# Patient Record
Sex: Female | Born: 1970 | Race: White | Hispanic: No | Marital: Single | Smoking: Never smoker
Health system: Southern US, Community
[De-identification: ages and names within clinical notes are randomized; demographics above are authoritative.]

## PROBLEM LIST (undated history)

## (undated) DIAGNOSIS — F329 Major depressive disorder, single episode, unspecified: Secondary | ICD-10-CM

## (undated) DIAGNOSIS — Z9889 Other specified postprocedural states: Secondary | ICD-10-CM

## (undated) DIAGNOSIS — R112 Nausea with vomiting, unspecified: Secondary | ICD-10-CM

## (undated) DIAGNOSIS — F32A Depression, unspecified: Secondary | ICD-10-CM

## (undated) DIAGNOSIS — H18609 Keratoconus, unspecified, unspecified eye: Secondary | ICD-10-CM

## (undated) DIAGNOSIS — Z789 Other specified health status: Secondary | ICD-10-CM

## (undated) DIAGNOSIS — N938 Other specified abnormal uterine and vaginal bleeding: Secondary | ICD-10-CM

## (undated) HISTORY — DX: Other specified abnormal uterine and vaginal bleeding: N93.8

## (undated) HISTORY — DX: Keratoconus, unspecified, unspecified eye: H18.609

## (undated) HISTORY — PX: DILATION AND CURETTAGE OF UTERUS: SHX78

## (undated) HISTORY — DX: Depression, unspecified: F32.A

## (undated) HISTORY — DX: Major depressive disorder, single episode, unspecified: F32.9

---

## 1999-06-25 ENCOUNTER — Inpatient Hospital Stay (HOSPITAL_COMMUNITY): Admission: AD | Admit: 1999-06-25 | Discharge: 1999-06-28 | Payer: Self-pay | Admitting: *Deleted

## 2001-10-25 ENCOUNTER — Other Ambulatory Visit: Admission: RE | Admit: 2001-10-25 | Discharge: 2001-10-25 | Payer: Self-pay | Admitting: *Deleted

## 2001-11-12 ENCOUNTER — Emergency Department (HOSPITAL_COMMUNITY): Admission: EM | Admit: 2001-11-12 | Discharge: 2001-11-13 | Payer: Self-pay | Admitting: Emergency Medicine

## 2001-11-12 ENCOUNTER — Encounter: Payer: Self-pay | Admitting: Emergency Medicine

## 2002-12-10 ENCOUNTER — Other Ambulatory Visit: Admission: RE | Admit: 2002-12-10 | Discharge: 2002-12-10 | Payer: Self-pay | Admitting: Obstetrics and Gynecology

## 2004-01-13 ENCOUNTER — Other Ambulatory Visit: Admission: RE | Admit: 2004-01-13 | Discharge: 2004-01-13 | Payer: Self-pay | Admitting: Obstetrics and Gynecology

## 2005-02-22 ENCOUNTER — Ambulatory Visit (HOSPITAL_COMMUNITY): Admission: RE | Admit: 2005-02-22 | Discharge: 2005-02-22 | Payer: Self-pay | Admitting: Obstetrics and Gynecology

## 2005-02-22 ENCOUNTER — Encounter (INDEPENDENT_AMBULATORY_CARE_PROVIDER_SITE_OTHER): Payer: Self-pay | Admitting: *Deleted

## 2005-08-15 ENCOUNTER — Ambulatory Visit: Payer: Self-pay | Admitting: Internal Medicine

## 2006-04-10 ENCOUNTER — Ambulatory Visit: Payer: Self-pay | Admitting: Internal Medicine

## 2006-05-01 ENCOUNTER — Ambulatory Visit: Payer: Self-pay | Admitting: Internal Medicine

## 2006-09-21 ENCOUNTER — Ambulatory Visit: Payer: Self-pay | Admitting: Internal Medicine

## 2007-02-12 ENCOUNTER — Ambulatory Visit: Payer: Self-pay | Admitting: Family Medicine

## 2010-11-11 ENCOUNTER — Encounter: Payer: Self-pay | Admitting: Internal Medicine

## 2010-11-11 ENCOUNTER — Ambulatory Visit (INDEPENDENT_AMBULATORY_CARE_PROVIDER_SITE_OTHER): Payer: 59 | Admitting: Internal Medicine

## 2010-11-11 DIAGNOSIS — T23009A Burn of unspecified degree of unspecified hand, unspecified site, initial encounter: Secondary | ICD-10-CM | POA: Insufficient documentation

## 2010-11-16 ENCOUNTER — Telehealth: Payer: Self-pay | Admitting: Internal Medicine
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
## 2010-11-24 NOTE — Progress Notes (Signed)
Summary: hydrocodone--pharmacy needs strength info  Phone Note Refill Request Message from:  Fax from Pharmacy on November 16, 2010 9:38 AM  Refills Requested: Medication #1:  HYDROCODONE-ACETAMINOPHEN 5-300 MG TABS 1 or 2 at bedtime as needed for pain. [facility] [address], [place], [place]    phone  [phone]  fax - [phone]    qty = 25      *******NOTE*******    pharmacy requests fax back to verify what strength you want this patient on----thanks  Next Appointment Scheduled: none Initial call taken by: Jerolyn Shin,  November 16, 2010 9:39 AM  Follow-up for Phone Call        spoke with the pharmacist and she stated HYDROCODONE-ACETAMINOPHEN  5-300 is no longer available, she said the new strength that they have available is  5-325mg  ---would you like to change? Follow-up by: Almeta Monas CMA Duncan Dull),  November 16, 2010 10:02 AM  Additional Follow-up for Phone Call Additional follow up Details #1::        yes Tijana Walder E. Faye Sanfilippo MD  November 16, 2010 12:09 PM     Additional Follow-up for Phone Call Additional follow up Details #2::    spoke with pharmacy and they will correct the RX and fill it for 5-325.... Rama Sorci CMA Duncan Dull)  November 16, 2010 4:43 PM

## 2011-01-10 ENCOUNTER — Emergency Department (HOSPITAL_BASED_OUTPATIENT_CLINIC_OR_DEPARTMENT_OTHER)
Admission: EM | Admit: 2011-01-10 | Discharge: 2011-01-10 | Disposition: A | Discharge status: Home / Self Care | Payer: 59 | Attending: Emergency Medicine | Admitting: Emergency Medicine

## 2011-01-10 ENCOUNTER — Emergency Department (INDEPENDENT_AMBULATORY_CARE_PROVIDER_SITE_OTHER): Payer: 59

## 2011-01-10 DIAGNOSIS — W2203XA Walked into furniture, initial encounter: Secondary | ICD-10-CM

## 2011-01-10 DIAGNOSIS — Y92009 Unspecified place in unspecified non-institutional (private) residence as the place of occurrence of the external cause: Secondary | ICD-10-CM | POA: Insufficient documentation

## 2011-01-10 DIAGNOSIS — R51 Headache: Secondary | ICD-10-CM | POA: Insufficient documentation

## 2011-01-10 DIAGNOSIS — F0781 Postconcussional syndrome: Secondary | ICD-10-CM | POA: Insufficient documentation

## 2011-01-10 DIAGNOSIS — R110 Nausea: Secondary | ICD-10-CM

## 2011-01-10 DIAGNOSIS — S0990XA Unspecified injury of head, initial encounter: Secondary | ICD-10-CM | POA: Insufficient documentation

## 2011-01-10 DIAGNOSIS — W2209XA Striking against other stationary object, initial encounter: Secondary | ICD-10-CM | POA: Insufficient documentation

## 2011-01-10 DIAGNOSIS — R443 Hallucinations, unspecified: Secondary | ICD-10-CM

## 2011-02-18 NOTE — Op Note (Signed)
[name], [name]             ACCOUNT NO.:  [id number]   MEDICAL RECORD NO.:  [id number]          PATIENT TYPE:  AMB   LOCATION:  [facility]                           [facility]   PHYSICIAN:  Lenoard Aden, M.D.DATE OF BIRTH:  [birth year]   DATE OF PROCEDURE:  02/22/2005  DATE OF DISCHARGE:                                 OPERATIVE REPORT   PREOPERATIVE DIAGNOSIS:  Missed abortion at 7 weeks.   POSTOPERATIVE DIAGNOSIS:  Missed abortion at 7 weeks.   PROCEDURE:  Suction dilatation and evacuation.   ANESTHESIA:  MAC, paracervical.   ESTIMATED BLOOD LOSS:  Less than 50 cc.   COMPLICATIONS:  None.   DRAINS:  None.   COUNTS:  Correct.   DISPOSITION:  The patient to recovery in good condition.   BRIEF OPERATIVE NOTE:  After being apprised of the risks of anesthesia,  infection, bleeding, and intra-abdominal injuries with need for repair, the  patient is brought to the operating room where she is administered IV  sedation without difficulty.  Prepped and draped in the usual sterile  fashion and catheterized until the bladder is empty.  Exam under anesthesia  reveals a 6 week anteflexed uterus, no adnexal masses.  The speculum is  placed.  A single-tooth tenaculum placed on the anterior lip of the cervix,  grasping the cervix without difficulty.  Paracervical block using 20 cc of  the Xylocaine solution placed.  The uterus is sounded to 8 cm and the cervix  easily dilated up to a #21 Pratt dilator.  Suction curette placed, #7  curved.  Products of conception noted in the tubing, collected, and sent to  pathology.  Repeat suction and curettage in a four-quadrant method reveals  the cavity to be empty.  Estimated blood loss as noted.  All instruments  were removed from the vagina.  Good hemostasis noted.  The uterus appears to  be small and anteflexed.  No adnexal masses appreciated.  The patient is  awakened and transferred to recovery in good condition.     RJT/MEDQ  D:   02/22/2005  T:  02/22/2005  Job:  [id number]   cc:   Marvel Plan

## 2013-04-19 LAB — HM MAMMOGRAPHY: HM Mammogram: NEGATIVE

## 2013-08-03 HISTORY — PX: TUBAL LIGATION: SHX77

## 2013-08-20 ENCOUNTER — Other Ambulatory Visit: Payer: Self-pay | Admitting: Obstetrics and Gynecology

## 2013-08-23 ENCOUNTER — Encounter (HOSPITAL_COMMUNITY): Payer: Self-pay | Admitting: *Deleted

## 2013-08-25 NOTE — H&P (Signed)
NAMEJILLANA, [name] NO.:  [id number]  MEDICAL RECORD NO.:  [id number]  LOCATION:  [facility]                         [facility]ILITY:  [facility]  PHYSICIAN:  Lenoard Aden, M.D.DATE OF BIRTH:  [birth year]  DATE OF ADMISSION:  08/20/2013 DATE OF DISCHARGE:                             HISTORY & PHYSICAL   CHIEF COMPLAINT:  Refractory menorrhagia and desire for elective sterilization.  HISTORY OF PRESENT ILLNESS:  She is a 42 year old white female G3, P2 who presents with aforementioned indications for intervention.  ALLERGIES:  She has allergies to TROVAN.  MEDICATIONS:  None.  SOCIAL HISTORY:  She is a nonsmoker, nondrinker.  She denies domestic or physical violence.  She has a history of vaginal delivery x2 and SAB x1.  PHYSICAL EXAMINATION:  GENERAL:  She is a well-developed, well- nourished, white female, in no acute distress. HEENT:  Normal. NECK:  Supple.  Full range of motion. LUNGS:  Clear. HEART:  Regular rate and rhythm. ABDOMEN:  Soft, nontender. PELVIC:  Uterus to be slightly enlarged, anteflexed with no adnexal masses. EXTREMITIES:  No cords. NEUROLOGIC:  Nonfocal. SKIN:  Intact.  IMPRESSION: 1. Refractory menorrhagia. 2. Desire for elective sterilization.  PLAN:  Proceed with diagnostic laparoscopy, laparoscopic tubal ligation, diagnostic hysteroscopy with D and C and NovaSure endometrial ablation. Risks of anesthesia, infection, bleeding, injury to surrounding organs with possible need for repair is discussed, delayed versus immediate complications to include bowel and bladder injury noted.  The patient acknowledges and wishes to proceed.  The patient was offered options of Mirena IUD insertion and Essure tubal ligation methods she declined.     Lenoard Aden, M.D.     RJT/MEDQ  D:  08/25/2013  T:  08/25/2013  Job:  [id number]

## 2013-08-26 ENCOUNTER — Encounter (HOSPITAL_COMMUNITY): Payer: 59 | Admitting: Anesthesiology

## 2013-08-26 ENCOUNTER — Ambulatory Visit (HOSPITAL_COMMUNITY)
Admission: RE | Admit: 2013-08-26 | Discharge: 2013-08-26 | Disposition: A | Discharge status: Home / Self Care | Payer: 59 | Source: Ambulatory Visit | Attending: Obstetrics and Gynecology | Admitting: Obstetrics and Gynecology

## 2013-08-26 ENCOUNTER — Ambulatory Visit (HOSPITAL_COMMUNITY): Payer: 59 | Admitting: Anesthesiology

## 2013-08-26 ENCOUNTER — Encounter (HOSPITAL_COMMUNITY)
Admission: RE | Disposition: A | Discharge status: Home / Self Care | Payer: Self-pay | Source: Ambulatory Visit | Attending: Obstetrics and Gynecology

## 2013-08-26 ENCOUNTER — Encounter (HOSPITAL_COMMUNITY): Payer: Self-pay | Admitting: *Deleted

## 2013-08-26 DIAGNOSIS — N920 Excessive and frequent menstruation with regular cycle: Secondary | ICD-10-CM | POA: Insufficient documentation

## 2013-08-26 DIAGNOSIS — Z302 Encounter for sterilization: Secondary | ICD-10-CM | POA: Insufficient documentation

## 2013-08-26 DIAGNOSIS — N840 Polyp of corpus uteri: Secondary | ICD-10-CM | POA: Insufficient documentation

## 2013-08-26 HISTORY — DX: Other specified postprocedural states: Z98.890

## 2013-08-26 HISTORY — PX: LAPAROSCOPIC TUBAL LIGATION: SHX1937

## 2013-08-26 HISTORY — PX: DILITATION & CURRETTAGE/HYSTROSCOPY WITH NOVASURE ABLATION: SHX5568

## 2013-08-26 HISTORY — DX: Nausea with vomiting, unspecified: R11.2

## 2013-08-26 HISTORY — DX: Other specified health status: Z78.9

## 2013-08-26 LAB — CBC
Hemoglobin: 12 g/dL (ref 12.0–15.0)
MCH: 26.4 pg (ref 26.0–34.0)
MCHC: 33.1 g/dL (ref 30.0–36.0)
MCV: 79.7 fL (ref 78.0–100.0)
RBC: 4.54 MIL/uL (ref 3.87–5.11)

## 2013-08-26 SURGERY — DILATATION & CURETTAGE/HYSTEROSCOPY WITH NOVASURE ABLATION
Anesthesia: General | Site: Vagina | Wound class: Clean Contaminated

## 2013-08-26 MED ORDER — FENTANYL CITRATE 0.05 MG/ML IJ SOLN
25.0000 ug | INTRAMUSCULAR | Status: DC | PRN
  Administered 2013-08-26 (×3): 50 ug via INTRAVENOUS

## 2013-08-26 MED ORDER — FENTANYL CITRATE 0.05 MG/ML IJ SOLN
INTRAMUSCULAR | Status: AC
  Administered 2013-08-26: 50 ug via INTRAVENOUS
  Filled 2013-08-26: qty 2

## 2013-08-26 MED ORDER — BUPIVACAINE HCL (PF) 0.25 % IJ SOLN
INTRAMUSCULAR | Status: AC
  Filled 2013-08-26: qty 10

## 2013-08-26 MED ORDER — OXYCODONE-ACETAMINOPHEN 5-325 MG PO TABS: 1.0000 | ORAL_TABLET | ORAL | Status: DC | PRN

## 2013-08-26 MED ORDER — KETOROLAC TROMETHAMINE 30 MG/ML IJ SOLN: 15.0000 mg | Freq: Once | INTRAMUSCULAR | Status: DC | PRN

## 2013-08-26 MED ORDER — FENTANYL CITRATE 0.05 MG/ML IJ SOLN
INTRAMUSCULAR | Status: DC | PRN
  Administered 2013-08-26 (×2): 100 ug via INTRAVENOUS
  Administered 2013-08-26: 50 ug via INTRAVENOUS

## 2013-08-26 MED ORDER — MEPERIDINE HCL 25 MG/ML IJ SOLN
INTRAMUSCULAR | Status: AC
  Filled 2013-08-26: qty 1

## 2013-08-26 MED ORDER — LIDOCAINE HCL (CARDIAC) 20 MG/ML IV SOLN
INTRAVENOUS | Status: DC | PRN
  Administered 2013-08-26: 40 mg via INTRAVENOUS

## 2013-08-26 MED ORDER — MIDAZOLAM HCL 2 MG/2ML IJ SOLN
INTRAMUSCULAR | Status: DC | PRN
  Administered 2013-08-26: 2 mg via INTRAVENOUS

## 2013-08-26 MED ORDER — SODIUM CHLORIDE 0.9 % IJ SOLN
INTRAMUSCULAR | Status: AC
  Filled 2013-08-26: qty 50

## 2013-08-26 MED ORDER — BUPIVACAINE HCL (PF) 0.5 % IJ SOLN
INTRAMUSCULAR | Status: AC
  Filled 2013-08-26: qty 30

## 2013-08-26 MED ORDER — PROPOFOL 10 MG/ML IV BOLUS
INTRAVENOUS | Status: DC | PRN
  Administered 2013-08-26: 150 mg via INTRAVENOUS

## 2013-08-26 MED ORDER — LACTATED RINGERS IV SOLN
INTRAVENOUS | Status: DC
  Administered 2013-08-26: 12:00:00 via INTRAVENOUS
  Administered 2013-08-26: 125 mL/h via INTRAVENOUS

## 2013-08-26 MED ORDER — FENTANYL CITRATE 0.05 MG/ML IJ SOLN
INTRAMUSCULAR | Status: AC
  Filled 2013-08-26: qty 5

## 2013-08-26 MED ORDER — GLYCOPYRROLATE 0.2 MG/ML IJ SOLN
INTRAMUSCULAR | Status: DC | PRN
  Administered 2013-08-26: 0.4 mg via INTRAVENOUS
  Administered 2013-08-26: 0.2 mg via INTRAVENOUS

## 2013-08-26 MED ORDER — DEXAMETHASONE SODIUM PHOSPHATE 10 MG/ML IJ SOLN
INTRAMUSCULAR | Status: DC | PRN
  Administered 2013-08-26: 10 mg via INTRAVENOUS

## 2013-08-26 MED ORDER — LACTATED RINGERS IR SOLN
Status: DC | PRN
  Administered 2013-08-26: 3000 mL

## 2013-08-26 MED ORDER — CEFAZOLIN SODIUM-DEXTROSE 2-3 GM-% IV SOLR
INTRAVENOUS | Status: AC
  Administered 2013-08-26: 2 g via INTRAVENOUS
  Filled 2013-08-26: qty 50

## 2013-08-26 MED ORDER — PROPOFOL 10 MG/ML IV EMUL
INTRAVENOUS | Status: AC
  Filled 2013-08-26: qty 20

## 2013-08-26 MED ORDER — LIDOCAINE HCL (CARDIAC) 20 MG/ML IV SOLN
INTRAVENOUS | Status: AC
  Filled 2013-08-26: qty 5

## 2013-08-26 MED ORDER — METOCLOPRAMIDE HCL 5 MG/ML IJ SOLN
INTRAMUSCULAR | Status: AC
  Filled 2013-08-26: qty 2

## 2013-08-26 MED ORDER — ONDANSETRON HCL 4 MG/2ML IJ SOLN
INTRAMUSCULAR | Status: DC | PRN
  Administered 2013-08-26: 4 mg via INTRAVENOUS

## 2013-08-26 MED ORDER — MIDAZOLAM HCL 2 MG/2ML IJ SOLN
INTRAMUSCULAR | Status: AC
  Filled 2013-08-26: qty 2

## 2013-08-26 MED ORDER — ACETAMINOPHEN 10 MG/ML IV SOLN
1000.0000 mg | Freq: Once | INTRAVENOUS | Status: DC
  Filled 2013-08-26: qty 100

## 2013-08-26 MED ORDER — MIDAZOLAM HCL 2 MG/2ML IJ SOLN: 0.5000 mg | Freq: Once | INTRAMUSCULAR | Status: DC | PRN

## 2013-08-26 MED ORDER — KETOROLAC TROMETHAMINE 30 MG/ML IJ SOLN
INTRAMUSCULAR | Status: DC | PRN
  Administered 2013-08-26: 30 mg via INTRAVENOUS

## 2013-08-26 MED ORDER — CEFAZOLIN SODIUM-DEXTROSE 2-3 GM-% IV SOLR: 2.0000 g | INTRAVENOUS | Status: DC

## 2013-08-26 MED ORDER — ROCURONIUM BROMIDE 100 MG/10ML IV SOLN
INTRAVENOUS | Status: DC | PRN
  Administered 2013-08-26: 30 mg via INTRAVENOUS

## 2013-08-26 MED ORDER — METOCLOPRAMIDE HCL 5 MG/ML IJ SOLN
10.0000 mg | Freq: Once | INTRAMUSCULAR | Status: AC
  Administered 2013-08-26: 10 mg via INTRAVENOUS

## 2013-08-26 MED ORDER — SCOPOLAMINE 1 MG/3DAYS TD PT72
1.0000 | MEDICATED_PATCH | Freq: Once | TRANSDERMAL | Status: DC
  Administered 2013-08-26: 1.5 mg via TRANSDERMAL

## 2013-08-26 MED ORDER — ONDANSETRON HCL 4 MG/2ML IJ SOLN
INTRAMUSCULAR | Status: AC
  Filled 2013-08-26: qty 2

## 2013-08-26 MED ORDER — SCOPOLAMINE 1 MG/3DAYS TD PT72
MEDICATED_PATCH | TRANSDERMAL | Status: AC
  Filled 2013-08-26: qty 1

## 2013-08-26 MED ORDER — FENTANYL CITRATE 0.05 MG/ML IJ SOLN
INTRAMUSCULAR | Status: AC
  Filled 2013-08-26: qty 2

## 2013-08-26 MED ORDER — VASOPRESSIN 20 UNIT/ML IJ SOLN
INTRAMUSCULAR | Status: AC
  Filled 2013-08-26: qty 1

## 2013-08-26 MED ORDER — NEOSTIGMINE METHYLSULFATE 1 MG/ML IJ SOLN
INTRAMUSCULAR | Status: DC | PRN
  Administered 2013-08-26: 2 mg via INTRAVENOUS

## 2013-08-26 MED ORDER — ACETAMINOPHEN 10 MG/ML IV SOLN: 1000.0000 mg | Freq: Four times a day (QID) | INTRAVENOUS | Status: DC

## 2013-08-26 MED ORDER — MEPERIDINE HCL 25 MG/ML IJ SOLN
6.2500 mg | INTRAMUSCULAR | Status: DC | PRN
  Administered 2013-08-26: 12.5 mg via INTRAVENOUS

## 2013-08-26 MED ORDER — BUPIVACAINE HCL (PF) 0.25 % IJ SOLN
INTRAMUSCULAR | Status: DC | PRN
  Administered 2013-08-26: 10 mL

## 2013-08-26 MED ORDER — PROMETHAZINE HCL 25 MG/ML IJ SOLN: 6.2500 mg | INTRAMUSCULAR | Status: DC | PRN

## 2013-08-26 SURGICAL SUPPLY — 24 items
ABLATOR ENDOMETRIAL BIPOLAR (ABLATOR) ×3 IMPLANT
ADH SKN CLS APL DERMABOND .7 (GAUZE/BANDAGES/DRESSINGS) ×2
CATH ROBINSON RED A/P 16FR (CATHETERS) ×3 IMPLANT
CLOTH BEACON ORANGE TIMEOUT ST (SAFETY) ×3 IMPLANT
CONTAINER PREFILL 10% NBF 60ML (FORM) ×6 IMPLANT
DERMABOND ADVANCED (GAUZE/BANDAGES/DRESSINGS) ×1
DERMABOND ADVANCED .7 DNX12 (GAUZE/BANDAGES/DRESSINGS) ×2 IMPLANT
DRESSING TELFA 8X3 (GAUZE/BANDAGES/DRESSINGS) ×3 IMPLANT
GLOVE BIO SURGEON STRL SZ7.5 (GLOVE) ×3 IMPLANT
GOWN PREVENTION PLUS LG XLONG (DISPOSABLE) ×3 IMPLANT
GOWN PREVENTION PLUS XLARGE (GOWN DISPOSABLE) ×3 IMPLANT
GOWN STRL REIN XL XLG (GOWN DISPOSABLE) ×6 IMPLANT
PACK HYSTEROSCOPY LF (CUSTOM PROCEDURE TRAY) ×3 IMPLANT
PACK LAPAROSCOPY BASIN (CUSTOM PROCEDURE TRAY) ×3 IMPLANT
PAD OB MATERNITY 4.3X12.25 (PERSONAL CARE ITEMS) ×3 IMPLANT
PAD PREP 24X48 CUFFED NSTRL (MISCELLANEOUS) ×3 IMPLANT
SOLUTION ELECTROLUBE (MISCELLANEOUS) ×3 IMPLANT
SUT VICRYL 0 UR6 27IN ABS (SUTURE) ×3 IMPLANT
SUT VICRYL 4-0 PS2 18IN ABS (SUTURE) IMPLANT
SYR TB 1ML 25GX5/8 (SYRINGE) ×3 IMPLANT
TOWEL OR 17X24 6PK STRL BLUE (TOWEL DISPOSABLE) ×6 IMPLANT
TROCAR XCEL DIL TIP R 11M (ENDOMECHANICALS) ×3 IMPLANT
WARMER LAPAROSCOPE (MISCELLANEOUS) ×3 IMPLANT
WATER STERILE IRR 1000ML POUR (IV SOLUTION) ×3 IMPLANT

## 2013-08-26 NOTE — Progress Notes (Signed)
Patient ID: [name], female   DOB: November 04, 1970, 42 y.o.   MRN: [id number] Patient seen and examined. Consent witnessed and signed. No changes noted. Update completed. CBC    Component Value Date/Time   WBC 4.0 08/26/2013 0958   RBC 4.54 08/26/2013 0958   HGB 12.0 08/26/2013 0958   HCT 36.2 08/26/2013 0958   PLT 206 08/26/2013 0958   MCV 79.7 08/26/2013 0958   MCH 26.4 08/26/2013 0958   MCHC 33.1 08/26/2013 0958   RDW 14.2 08/26/2013 0958

## 2013-08-26 NOTE — Transfer of Care (Signed)
22Immediate Anesthesia Transfer of Care Note  Patient: [name]  Procedure(s) Performed: Procedure(s): DILATATION & CURETTAGE/HYSTEROSCOPY WITH NOVASURE ABLATION (N/A) LAPAROSCOPIC TUBAL LIGATION (Bilateral)  Patient Location: PACU  Anesthesia Type:General  Level of Consciousness: awake  Airway & Oxygen Therapy: Patient Spontanous Breathing and Patient connected to nasal cannula oxygen  Post-op Assessment: Report given to PACU RN and Post -op Vital signs reviewed and stable  Post vital signs: stable  Complications: No apparent anesthesia complications

## 2013-08-26 NOTE — Anesthesia Preprocedure Evaluation (Signed)
Anesthesia Evaluation  Patient identified by MRN, date of birth, ID band Patient awake    Reviewed: Allergy & Precautions, H&P , Patient's Chart, lab work & pertinent test results, reviewed documented beta blocker date and time   History of Anesthesia Complications (+) PONV and history of anesthetic complications  Airway Mallampati: II  TM Distance: >3 FB Neck ROM: full    Dental   Pulmonary  breath sounds clear to auscultation        Cardiovascular Exercise Tolerance: Good Rhythm:regular Rate:Normal     Neuro/Psych    GI/Hepatic   Endo/Other    Renal/GU      Musculoskeletal   Abdominal   Peds  Hematology   Anesthesia Other Findings   Reproductive/Obstetrics                             Anesthesia Physical Anesthesia Plan  ASA: II  Anesthesia Plan: General ETT   Post-op Pain Management:    Induction:   Airway Management Planned:   Additional Equipment:   Intra-op Plan:   Post-operative Plan:   Informed Consent: I have reviewed the patients History and Physical, chart, labs and discussed the procedure including the risks, benefits and alternatives for the proposed anesthesia with the patient or authorized representative who has indicated his/her understanding and acceptance.   Dental Advisory Given  Plan Discussed with: CRNA and Surgeon  Anesthesia Plan Comments:         Anesthesia Quick Evaluation  

## 2013-08-26 NOTE — Op Note (Signed)
08/26/2013  12:26 PM  PATIENT:  [name]  42 y.o. female  PRE-OPERATIVE DIAGNOSIS:  Menorrhagia, Desires Sterilization   POST-OPERATIVE DIAGNOSIS:  menorrhagia, desires sterilization, endometrial polyp  PROCEDURE:  Procedure(s): DILATATION & CURETTAGE/HYSTEROSCOPY WITH NOVASURE ABLATION LAPAROSCOPIC TUBAL LIGATION ENDOMETRIAL POLYPECTOMY  SURGEON:  Surgeon(s): Lenoard Aden, MD  ASSISTANTS: none   ANESTHESIA:   local and general  ESTIMATED BLOOD LOSS: minimal  DRAINS: none   LOCAL MEDICATIONS USED:  MARCAINE     SPECIMEN:  Source of Specimen:  EMC with polyp  DISPOSITION OF SPECIMEN:  PATHOLOGY  COUNTS:  YES  DICTATION #: [id number]  PLAN OF CARE: DC home  PATIENT DISPOSITION:  PACU - hemodynamically stable.

## 2013-08-26 NOTE — Anesthesia Postprocedure Evaluation (Signed)
  Anesthesia Post-op Note  Anesthesia Post Note  Patient: [name]  Procedure(s) Performed: Procedure(s) (LRB): DILATATION & CURETTAGE/HYSTEROSCOPY WITH NOVASURE ABLATION (N/A) LAPAROSCOPIC TUBAL LIGATION (Bilateral)  Anesthesia type: General  Patient location: PACU  Post pain: Pain level controlled  Post assessment: Post-op Vital signs reviewed  Last Vitals:  Filed Vitals:   08/26/13 1330  BP: 98/49  Pulse: 64  Temp:   Resp: 14    Post vital signs: Reviewed  Level of consciousness: sedated  Complications: No apparent anesthesia complications

## 2013-08-27 ENCOUNTER — Encounter (HOSPITAL_COMMUNITY): Payer: Self-pay | Admitting: Obstetrics and Gynecology

## 2013-08-27 NOTE — Op Note (Signed)
NAMEJAIDENCE, GEISLER NO.:  [id number]  MEDICAL RECORD NO.:  [id number]  LOCATION:  [facility]                          [facility]  PHYSICIAN:  Lenoard Aden, M.D.DATE OF BIRTH:  1971-03-05  DATE OF PROCEDURE: DATE OF DISCHARGE:  08/26/2013                              OPERATIVE REPORT   PREOPERATIVE DIAGNOSIS:  Desire for elective sterilization, refractory menorrhagia.  POSTOPERATIVE DIAGNOSES:  Desire for elective sterilization, refractory menorrhagia.  Endometrial polyp.  PROCEDURE:  Laparoscopic tubal ligation, diagnostic hysteroscopy with D and C, and endometrial polypectomy.  SURGEON:  Lenoard Aden, M.D.  ASSISTANT:  None.  ANESTHESIA:  General and local.  ESTIMATED BLOOD LOSS:  Less than 50 mL.  FLUID DEFICIT:  150 mL.  COMPLICATIONS:  None.  COUNTS:  Correct.  The patient was taken to the recovery room in good condition.  DESCRIPTION OF PROCEDURE:  After being apprised of the risks of anesthesia, infection, bleeding, injury to surrounding organs, possible need for repair, delayed versus immediate complications to include bowel and bladder injury, possible need for repair, failure risk of tubal ligation of 5-07/999.  The patient's consents were signed.  Time-out was done.  She was brought to the operating room where she was administered general anesthetic without complications.  She was prepped and draped in usual sterile fashion.  Feet were placed in the Yellofin stirrups.  Exam under anesthesia, there was a bulky anteflexed uterus with no adnexal masses.  Hulka tenaculum was placed per vagina. Infraumbilical incision was made with a scalpel.  Veress needle was placed with opening pressure of -2.  Three liters of CO2 insufflated without difficulty.  Trocar was placed, atraumatic trocar entry. Visualization revealed some perihepatic adhesions, normal lower abdominal and pelvic survey with apparent normal tubes, ovaries,  normal posterior cul-de-sac, some adhesions in the anterior cul-de-sac were noted, otherwise normal-appearing uterus.  At this time, the Kleppinger bipolar cauteries entered through the operative port and the right tube was traced out to the fimbriated end and ampullary-isthmic portion of the tube was cauterized to a resistance of 0 and 3 contiguous areas. The same procedure was done on the left side.  After it was traced out to the fimbriated end, both tubes were then divided in the cauterized portion with hook scissors.  Tubal lumens were visualized.  CO2 was released and good hemostasis was assured.  At this time, CO2 was then released and trocars were removed under direct visualization.  Incision was closed using 0 Vicryl, 4-0 Vicryl, and Dermabond.  Attention was turned to the pelvic portion of procedure whereby the uterus was easily dilated up to a #25 Pratt dilator.  Hysteroscope was placed. Visualization revealed two polypoid masses along the anterior uterine wall.  These were resected using polyp forceps and then D and C, and sharp curettage.  Completely removed the specimen, which was sent with the D and C specimen for pathologic confirmation and permanent section. At this time, the NovaSure device was entered to a width of 3.8, length of 5.5.  The procedure was initiated after a negative CO2 test was performed, and it was done for an 80 seconds total to a power of 150 watts.  At  the termination of the NovaSure procedure, the device was removed in a standard fashion, inspected and found to be intact.  The uterus was re-visualized with hysteroscopy revealing a well-ablated endometrial cavity and no evidence of uterine perforation.  At this time, procedure was terminated.  The patient was awakened and transferred to recovery in good condition.     Lenoard Aden, M.D.     RJT/MEDQ  D:  08/26/2013  T:  08/27/2013  Job:  [id number]

## 2015-01-05 ENCOUNTER — Encounter: Payer: Self-pay | Admitting: Family

## 2015-01-05 ENCOUNTER — Ambulatory Visit (INDEPENDENT_AMBULATORY_CARE_PROVIDER_SITE_OTHER): Payer: 59 | Admitting: Family

## 2015-01-05 VITALS — BP 100/70 | HR 71 | Temp 97.8°F | Resp 16 | Ht 61.25 in | Wt 119.6 lb

## 2015-01-05 DIAGNOSIS — F4323 Adjustment disorder with mixed anxiety and depressed mood: Secondary | ICD-10-CM

## 2015-01-05 MED ORDER — ALPRAZOLAM 0.5 MG PO TABS: 0.5000 mg | ORAL_TABLET | Freq: Three times a day (TID) | ORAL | Status: DC | PRN

## 2015-01-05 MED ORDER — CITALOPRAM HYDROBROMIDE 40 MG PO TABS: 40.0000 mg | ORAL_TABLET | Freq: Every day | ORAL | Status: DC

## 2015-01-05 NOTE — Patient Instructions (Signed)
Please go to lab for urine drug screen. Increase citalopram from 30mg  to 40mg . Please contact [facility] to schedule an appointment with one of our therapists at our [place] Location [phone] Follow up in 6 weeks.

## 2015-01-05 NOTE — Assessment & Plan Note (Addendum)
I suggested that she try getting in with one of our therapists who may have increased availability to see her.  Will increase citalopram from 30mg  to 40mg  once daily. Continue xanax prn (advised ok to take HS prn as well).  A controlled substance contract is signed.  Will change xanax tab from 1mg  to 0.5mg  as she has been cutting in half. 30 minutes spent with pt today.  >50% of this time was spent counseling pt on anxiety and depression.  Obtain urine drug screen.

## 2015-01-05 NOTE — Progress Notes (Signed)
Subjective:    Patient ID: [name], female    DOB: 12-01-1970, 44 y.o.   MRN: [id number]  HPI   Ms. Sue EspyGibson is a 44 yr old female who presents today to re-establish care. [place] and since that time has been followed by her GYN.  She presents today to discuss anxiety and depression.  She is currently in the middle of a separation. Her best friend is currently going through chemo.  She is following with [facility] (therapist) using EAP benefits. She has not been able to get in to see her therapist as often as she would like due to therapist's schedule.  GYN has been rx'ing citalopram- currently on 30mg . Notes some improvement on citalopram however she continues to have crying spells and anxiety at work and at home.  Feels unmotivated.   Uses xanax prn but breaks the 1mg  tabs into half or 1/3 because the full tab makes her too drowsy- uses zquil to help her to sleep.    Son is a Printmakerfreshman in college and she has a Printmakerfreshman in high school.  She is having trouble concentrating at work, frequently tearful.  Had depression after divorce 15 years ago.  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  Anxiety is a new issue, denies si/hi.    Last dose of xanax was several days ago.    Review of Systems See HPI  Past Medical History  Diagnosis Date  . PONV (postoperative nausea and vomiting)   . Medical history non-contributory   . Depression   . DUB (dysfunctional uterine bleeding)     History   Social History  . Marital Status: Married    Spouse Name: N/A  . Number of Children: N/A  . Years of Education: N/A   Occupational History  . Not on file.   Social History Main Topics  . Smoking status: Never Smoker   . Smokeless tobacco: Never Used  . Alcohol Use: 1.8 - 2.4 oz/week    3-4 Glasses of wine per week  . Drug Use: No  . Sexual Activity: Not on file   Other Topics Concern  . Not on file   Social  History Narrative   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[employer]       Past Surgical History  Procedure Laterality Date  . Dilation and curettage of uterus    . Dilitation & currettage/hystroscopy with novasure ablation N/A 08/26/2013    Procedure: DILATATION & CURETTAGE/HYSTEROSCOPY WITH NOVASURE ABLATION;  Surgeon: Lenoard Adenichard J Taavon, MD;  Location: [facility];  Service: Gynecology;  Laterality: N/A;  . Laparoscopic tubal ligation Bilateral 08/26/2013    Procedure: LAPAROSCOPIC TUBAL LIGATION;  Surgeon: Lenoard Adenichard J Taavon, MD;  Location: [facility];  Service: Gynecology;  Laterality: Bilateral;  . Tubal ligation  08/2013  . Cesarean section      1997 & 2000    Family History  Problem Relation Age of Onset  . Uterine cancer Mother   . Throat cancer Father     cigar smoker  . Cancer Father     throat and prostate  . Cancer Maternal Aunt 75    breast    No Known Allergies  No current outpatient prescriptions on file prior to visit.   No current facility-administered medications on file prior to visit.    BP 100/70 mmHg  Pulse 71  Temp(Src) 97.8 F (36.6 C) (Oral)  Resp 16  Ht 5' 1.25" (1.556 m)  Wt 119 lb 9.6 oz (54.25 kg)  BMI 22.41 kg/m2  SpO2 99%  LMP 01/05/2015       Objective:   Physical Exam  Constitutional: She is oriented to person, place, and time. She appears well-developed and well-nourished. No distress.  Neurological: She is alert and oriented to person, place, and time.  Psychiatric: Her behavior is normal. Judgment and thought content normal.  Mildly tearful during exam.           Assessment & Plan:

## 2015-01-05 NOTE — Progress Notes (Signed)
Pre visit review using our clinic review tool, if applicable. No additional management support is needed unless otherwise documented below in the visit note. 

## 2015-01-09 ENCOUNTER — Ambulatory Visit: Payer: 59 | Admitting: Psychology

## 2015-01-16 ENCOUNTER — Ambulatory Visit (INDEPENDENT_AMBULATORY_CARE_PROVIDER_SITE_OTHER): Payer: 59 | Admitting: Psychology

## 2015-01-16 DIAGNOSIS — F4323 Adjustment disorder with mixed anxiety and depressed mood: Secondary | ICD-10-CM

## 2015-01-21 ENCOUNTER — Telehealth: Payer: Self-pay | Admitting: Family

## 2015-01-21 MED ORDER — ALPRAZOLAM 0.5 MG PO TABS: 0.5000 mg | ORAL_TABLET | Freq: Three times a day (TID) | ORAL | Status: DC | PRN

## 2015-01-21 NOTE — Telephone Encounter (Signed)
Caller name: Cala Bradfordkimberly Relation to pt: self Call back number: [phone] Pharmacy: [facility] on [address] in [place]  Reason for call:   Requesting alprazolam refill. Currently on 1mg  but states that Efraim Kaufmannmelissa was going to drop her down to 0.5mg .

## 2015-01-21 NOTE — Telephone Encounter (Signed)
Rx faxed and pt has been notified. 

## 2015-01-21 NOTE — Telephone Encounter (Signed)
See rx. 

## 2015-01-21 NOTE — Telephone Encounter (Signed)
It doesn't look like Alprazolam Rx from 01/05/15 printed.  Please advise.

## 2015-01-30 ENCOUNTER — Ambulatory Visit (INDEPENDENT_AMBULATORY_CARE_PROVIDER_SITE_OTHER): Payer: 59 | Admitting: Psychology

## 2015-01-30 DIAGNOSIS — F4323 Adjustment disorder with mixed anxiety and depressed mood: Secondary | ICD-10-CM

## 2015-02-06 ENCOUNTER — Ambulatory Visit (INDEPENDENT_AMBULATORY_CARE_PROVIDER_SITE_OTHER): Payer: 59 | Admitting: Psychology

## 2015-02-06 DIAGNOSIS — F4323 Adjustment disorder with mixed anxiety and depressed mood: Secondary | ICD-10-CM | POA: Diagnosis not present

## 2015-02-10 ENCOUNTER — Other Ambulatory Visit: Payer: Self-pay | Admitting: Family

## 2015-02-10 NOTE — Telephone Encounter (Signed)
Pt has f/u with PCP 02/16/15. Please advise below request:  Medication name:  Name from pharmacy:  ALPRAZolam (XANAX) 0.5 MG tablet ALPRAZOLAM 0.5MG  TABLETS     Sig: TAKE 1 TABLET BY MOUTH THREE TIMES DAILY AS NEEDED FOR ANXIETY    Dispense: 30 tablet   Refills: 0   Start: 02/10/2015   Class: Normal    Requested on: 02/10/2015    Originally ordered on: 01/05/2015 01/21/2015

## 2015-02-11 NOTE — Telephone Encounter (Signed)
OK to send refill, but need to find out how much she is using and how often. She told me she was only using 1/2 to 1/3 pill at a time.  Has she increased frequency or amount?

## 2015-02-13 ENCOUNTER — Ambulatory Visit (INDEPENDENT_AMBULATORY_CARE_PROVIDER_SITE_OTHER): Payer: 59 | Admitting: Psychology

## 2015-02-13 DIAGNOSIS — F4323 Adjustment disorder with mixed anxiety and depressed mood: Secondary | ICD-10-CM

## 2015-02-13 NOTE — Telephone Encounter (Signed)
Rx given to front office.

## 2015-02-13 NOTE — Telephone Encounter (Signed)
Caller: Erlene SentersKimberly Gibson, self Ph# [phone],cell  Pt called back to f/u on RX for Xanax. She states she is taking 1 full pill one to two times per day. [facility] and can pick up RX if available. Please call her by 11:30ish if possible.

## 2015-02-13 NOTE — Telephone Encounter (Signed)
See rx. 

## 2015-02-16 ENCOUNTER — Ambulatory Visit (INDEPENDENT_AMBULATORY_CARE_PROVIDER_SITE_OTHER): Payer: 59 | Admitting: Family

## 2015-02-16 ENCOUNTER — Encounter: Payer: Self-pay | Admitting: Family

## 2015-02-16 VITALS — BP 110/68 | HR 69 | Temp 98.2°F | Resp 16 | Ht 61.25 in | Wt 122.2 lb

## 2015-02-16 DIAGNOSIS — G44209 Tension-type headache, unspecified, not intractable: Secondary | ICD-10-CM | POA: Diagnosis not present

## 2015-02-16 DIAGNOSIS — F4323 Adjustment disorder with mixed anxiety and depressed mood: Secondary | ICD-10-CM | POA: Diagnosis not present

## 2015-02-16 MED ORDER — VENLAFAXINE HCL ER 37.5 MG PO CP24: ORAL_CAPSULE | ORAL | Status: DC

## 2015-02-16 NOTE — Assessment & Plan Note (Signed)
I think stress and lack of sleep is contributing factor. We discussed trying to bet 7-8 hours a night of sleep.  Advised pt to limit use of NSAIDS due to GI side effects. Tylenol prn.
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
## 2015-02-16 NOTE — Patient Instructions (Addendum)
Start effexor 1 tab once daily for 3 days then increase to two tabs on day 4. Try to get 7-8 hours of sleep each night.  Continue your work with Blanchard Maneeri Bauert. Follow up in 2-3 weeks.

## 2015-02-16 NOTE — Progress Notes (Signed)
Pre visit review using our clinic review tool, if applicable. No additional management support is needed unless otherwise documented below in the visit note. 

## 2015-02-16 NOTE — Assessment & Plan Note (Signed)
Some improvement, but not optimally controlled.  Advised pt to continue her work with therapist. Will add Effexor to her citalopram. She would like to take 3 weeks off from work until she is stablized which I think is very reasonable.  I have asked her to provide us with FMLA paperwork for her employer.

## 2015-02-19 ENCOUNTER — Telehealth: Payer: Self-pay | Admitting: *Deleted

## 2015-02-19 NOTE — Telephone Encounter (Signed)
Disability forms received via fax from [employer]. Filled out as much as possible and [facility]. JG//CMA

## 2015-02-23 DIAGNOSIS — Z7689 Persons encountering health services in other specified circumstances: Secondary | ICD-10-CM

## 2015-02-23 NOTE — Telephone Encounter (Signed)
Completed paperwork along with OV notes faxed to [employer] successfully at [phone]. Called and left detailed message for pt. Originals placed up front for pt and copy sent for scanning. JG//CMA

## 2015-03-04 ENCOUNTER — Ambulatory Visit (INDEPENDENT_AMBULATORY_CARE_PROVIDER_SITE_OTHER): Payer: 59 | Admitting: Family

## 2015-03-04 ENCOUNTER — Encounter: Payer: Self-pay | Admitting: Family

## 2015-03-04 ENCOUNTER — Ambulatory Visit (INDEPENDENT_AMBULATORY_CARE_PROVIDER_SITE_OTHER): Payer: 59 | Admitting: Psychology

## 2015-03-04 VITALS — BP 92/68 | HR 78 | Temp 98.0°F | Resp 16 | Ht 61.25 in | Wt 121.2 lb

## 2015-03-04 DIAGNOSIS — Z0000 Encounter for general adult medical examination without abnormal findings: Secondary | ICD-10-CM

## 2015-03-04 DIAGNOSIS — F4323 Adjustment disorder with mixed anxiety and depressed mood: Secondary | ICD-10-CM | POA: Diagnosis not present

## 2015-03-04 MED ORDER — CITALOPRAM HYDROBROMIDE 40 MG PO TABS: 40.0000 mg | ORAL_TABLET | Freq: Every day | ORAL | Status: DC

## 2015-03-04 MED ORDER — VENLAFAXINE HCL ER 37.5 MG PO CP24: ORAL_CAPSULE | ORAL | Status: DC

## 2015-03-04 MED ORDER — ALPRAZOLAM 0.5 MG PO TABS: 0.5000 mg | ORAL_TABLET | Freq: Three times a day (TID) | ORAL | Status: DC | PRN

## 2015-03-04 NOTE — Patient Instructions (Signed)
Please complete lab work prior to leaving. Follow up in 3 months, sooner if problems/concerns.  

## 2015-03-04 NOTE — Assessment & Plan Note (Signed)
Improving, continue effexor, citalopram and prn xanax. Pt is comfortable returning to work  On 6/13. A return to work note is provided. Encouraged her to continue her work with her therapist. Follow up in 3 months, sooner if problems/concerns.
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
## 2015-03-05 ENCOUNTER — Encounter: Payer: Self-pay | Admitting: Family

## 2015-03-05 LAB — HIV ANTIBODY (ROUTINE TESTING W REFLEX): HIV 1&2 Ab, 4th Generation: NONREACTIVE

## 2015-03-11 ENCOUNTER — Ambulatory Visit (INDEPENDENT_AMBULATORY_CARE_PROVIDER_SITE_OTHER): Payer: 59 | Admitting: Psychology

## 2015-03-11 DIAGNOSIS — F4323 Adjustment disorder with mixed anxiety and depressed mood: Secondary | ICD-10-CM | POA: Diagnosis not present

## 2015-03-25 ENCOUNTER — Ambulatory Visit: Payer: 59 | Admitting: Psychology

## 2015-04-10 ENCOUNTER — Ambulatory Visit (INDEPENDENT_AMBULATORY_CARE_PROVIDER_SITE_OTHER): Payer: 59 | Admitting: Psychology

## 2015-04-10 DIAGNOSIS — F4323 Adjustment disorder with mixed anxiety and depressed mood: Secondary | ICD-10-CM

## 2015-04-27 ENCOUNTER — Other Ambulatory Visit: Payer: Self-pay | Admitting: Family

## 2015-04-27 NOTE — Telephone Encounter (Signed)
Pt signed CSC on 01/2015 but did not leave UDS. Pt is due for follow up in September but is not scheduled yet. Please advise below request:  Medication name:  Name from pharmacy:  ALPRAZolam (XANAX) 0.5 MG tablet ALPRAZOLAM 0.5MG  TABLETS     Sig: TAKE 1 TABLET BY MOUTH THREE TIMES DAILY AS NEEDED    Dispense: 45 tablet   Refills: 0   Start: 04/27/2015   Class: Normal    Requested on: 04/27/2015    Originally ordered on: 01/05/2015 03/13/2015

## 2015-04-27 NOTE — Telephone Encounter (Signed)
Rx signed and placed at front desk and is aware to leave UDS at time of pick up.

## 2015-04-27 NOTE — Telephone Encounter (Signed)
Ok to refill but needs to leave UDS when she picks up rx please.

## 2015-05-13 ENCOUNTER — Ambulatory Visit: Payer: 59 | Admitting: Psychology

## 2015-06-22 ENCOUNTER — Ambulatory Visit (INDEPENDENT_AMBULATORY_CARE_PROVIDER_SITE_OTHER): Payer: 59 | Admitting: Family

## 2015-06-22 ENCOUNTER — Encounter: Payer: Self-pay | Admitting: Family

## 2015-06-22 VITALS — BP 90/62 | HR 82 | Temp 97.8°F | Resp 16 | Ht 61.25 in | Wt 122.2 lb

## 2015-06-22 DIAGNOSIS — F4323 Adjustment disorder with mixed anxiety and depressed mood: Secondary | ICD-10-CM

## 2015-06-22 DIAGNOSIS — Z23 Encounter for immunization: Secondary | ICD-10-CM | POA: Diagnosis not present

## 2015-06-22 MED ORDER — CITALOPRAM HYDROBROMIDE 40 MG PO TABS: 40.0000 mg | ORAL_TABLET | Freq: Every day | ORAL | Status: DC

## 2015-06-22 MED ORDER — ALPRAZOLAM 0.5 MG PO TABS: 0.5000 mg | ORAL_TABLET | Freq: Three times a day (TID) | ORAL | Status: DC | PRN

## 2015-06-22 MED ORDER — VENLAFAXINE HCL ER 37.5 MG PO CP24: ORAL_CAPSULE | ORAL | Status: DC

## 2015-06-22 NOTE — Assessment & Plan Note (Signed)
Stable/improved on current meds. Continue same, refill provided for xanax, obtain UDS.

## 2015-06-22 NOTE — Patient Instructions (Addendum)
Please schedule a follow up appointment in 6 months.  Please complete urine test prior to leaving- UDS.

## 2015-06-22 NOTE — Progress Notes (Signed)
Pre visit review using our clinic review tool, if applicable. No additional management support is needed unless otherwise documented below in the visit note. 

## 2015-06-22 NOTE — Progress Notes (Signed)
Subjective:    Patient ID: [name], female    DOB: 1971-01-04, 44 y.o.   MRN: [id number]  HPI  Ms. [name] is a 44 yr old female who presents today for follow up of her anxiety and depression. She was last seen in June.  At that time she reported improvement in her symptoms with effexor, citalopram and prn xanax. She was given a note to return to work.  She reports that she uses xanax 2-3 x a week prn.  She is working with a therapist- but has not seen her in about 3 weeks.  Reports that overall her anxiety and depression remain well controlled. She is working and managing her work stress well at this point. Feels that the effexor and the citalopram are helping a lot.     Review of Systems See HPI  Past Medical History  Diagnosis Date  . PONV (postoperative nausea and vomiting)   . Medical history non-contributory   . Depression   . DUB (dysfunctional uterine bleeding)     Social History   Social History  . Marital Status: Married    Spouse Name: N/A  . Number of Children: N/A  . Years of Education: N/A   Occupational History  . Not on file.   Social History Main Topics  . Smoking status: Never Smoker   . Smokeless tobacco: Never Used  . Alcohol Use: 1.8 - 2.4 oz/week    3-4 Glasses of wine per week  . Drug Use: No  . Sexual Activity: Not on file   Other Topics Concern  . 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 Dilation and curettage of uterus    . Dilitation & currettage/hystroscopy with novasure ablation N/A 08/26/2013    Procedure: DILATATION & CURETTAGE/HYSTEROSCOPY WITH NOVASURE ABLATION;  Surgeon: Lenoard Aden, MD;  Location: [facility];  Service: Gynecology;  Laterality: N/A;  . Laparoscopic tubal ligation Bilateral 08/26/2013    Procedure: LAPAROSCOPIC TUBAL LIGATION;  Surgeon: Lenoard Aden, MD;  Location: [facility];  Service: Gynecology;  Laterality: Bilateral;   . Tubal ligation  08/2013  . Cesarean section      1997 & 2000    Family History  Problem Relation Age of Onset  . Uterine cancer Mother   . Throat cancer Father     cigar smoker  . Cancer Father     throat and prostate  . Cancer Maternal Aunt 75    breast    No Known Allergies  Current Outpatient Prescriptions on File Prior to Visit  Medication Sig Dispense Refill  . ALPRAZolam (XANAX) 0.5 MG tablet TAKE 1 TABLET BY MOUTH THREE TIMES DAILY AS NEEDED 45 tablet 0  . citalopram (CELEXA) 40 MG tablet Take 1 tablet (40 mg total) by mouth daily. 90 tablet 1  . venlafaxine XR (EFFEXOR XR) 37.5 MG 24 hr capsule One tab by mouth once daily for 3 days, then increase to two tabs once daily 180 capsule 1   No current facility-administered medications on file prior to visit.    BP 90/62 mmHg  Pulse 82  Temp(Src) 97.8 F (36.6 C) (Oral)  Resp 16  Ht 5' 1.25" (1.556 m)  Wt 122 lb 3.2 oz (55.43 kg)  BMI 22.89 kg/m2  SpO2 98%       Objective:   Physical Exam  Constitutional: She is oriented to  person, place, and time. She appears well-developed and well-nourished.  HENT:  Head: Normocephalic and atraumatic.  Cardiovascular: Normal rate, regular rhythm and normal heart sounds.   No murmur heard. Pulmonary/Chest: Effort normal and breath sounds normal. No respiratory distress. She has no wheezes.  Musculoskeletal: She exhibits no edema.  Neurological: She is alert and oriented to person, place, and time.  Psychiatric: She has a normal mood and affect. Her behavior is normal. Judgment and thought content normal.          Assessment & Plan:

## 2015-09-11 ENCOUNTER — Telehealth: Payer: Self-pay | Admitting: Family

## 2015-09-11 MED ORDER — ALPRAZOLAM 0.5 MG PO TABS: 0.5000 mg | ORAL_TABLET | Freq: Three times a day (TID) | ORAL | Status: DC | PRN

## 2015-09-11 NOTE — Telephone Encounter (Signed)
Rx called to pharmacist. Left detailed message on cell#.

## 2015-09-11 NOTE — Telephone Encounter (Signed)
[phone] [place] [place] [facility] [address] [facility] [address]  Reason for call: Pt wanting refill on alprazolam. She has 1 left.

## 2015-09-11 NOTE — Telephone Encounter (Signed)
Ok to send 45 tabs please.

## 2015-09-24 ENCOUNTER — Other Ambulatory Visit: Payer: Self-pay | Admitting: Family

## 2015-09-24 MED ORDER — CITALOPRAM HYDROBROMIDE 40 MG PO TABS: 40.0000 mg | ORAL_TABLET | Freq: Every day | ORAL | Status: DC

## 2015-09-24 NOTE — Telephone Encounter (Signed)
Rx faxed.    KP 

## 2015-09-24 NOTE — Telephone Encounter (Signed)
[phone] [place],  - [address] [facility] [address]  Reason for call: pt needing refill on citalopram. She took last 1 today. Takes 1 day.

## 2015-10-28 ENCOUNTER — Emergency Department (HOSPITAL_BASED_OUTPATIENT_CLINIC_OR_DEPARTMENT_OTHER): Payer: Managed Care, Other (non HMO)

## 2015-10-28 ENCOUNTER — Encounter (HOSPITAL_BASED_OUTPATIENT_CLINIC_OR_DEPARTMENT_OTHER): Payer: Self-pay

## 2015-10-28 ENCOUNTER — Emergency Department (HOSPITAL_BASED_OUTPATIENT_CLINIC_OR_DEPARTMENT_OTHER)
Admission: EM | Admit: 2015-10-28 | Discharge: 2015-10-28 | Disposition: A | Discharge status: Home / Self Care | Payer: Managed Care, Other (non HMO) | Attending: Emergency Medicine | Admitting: Emergency Medicine

## 2015-10-28 DIAGNOSIS — Z79899 Other long term (current) drug therapy: Secondary | ICD-10-CM | POA: Diagnosis not present

## 2015-10-28 DIAGNOSIS — Z8742 Personal history of other diseases of the female genital tract: Secondary | ICD-10-CM | POA: Diagnosis not present

## 2015-10-28 DIAGNOSIS — S060X0A Concussion without loss of consciousness, initial encounter: Secondary | ICD-10-CM | POA: Insufficient documentation

## 2015-10-28 DIAGNOSIS — S161XXA Strain of muscle, fascia and tendon at neck level, initial encounter: Secondary | ICD-10-CM | POA: Diagnosis not present

## 2015-10-28 DIAGNOSIS — Y998 Other external cause status: Secondary | ICD-10-CM | POA: Diagnosis not present

## 2015-10-28 DIAGNOSIS — Y9301 Activity, walking, marching and hiking: Secondary | ICD-10-CM | POA: Insufficient documentation

## 2015-10-28 DIAGNOSIS — W208XXA Other cause of strike by thrown, projected or falling object, initial encounter: Secondary | ICD-10-CM | POA: Diagnosis not present

## 2015-10-28 DIAGNOSIS — Y9289 Other specified places as the place of occurrence of the external cause: Secondary | ICD-10-CM | POA: Diagnosis not present

## 2015-10-28 DIAGNOSIS — F329 Major depressive disorder, single episode, unspecified: Secondary | ICD-10-CM | POA: Diagnosis not present

## 2015-10-28 DIAGNOSIS — S0990XA Unspecified injury of head, initial encounter: Secondary | ICD-10-CM | POA: Diagnosis present

## 2015-10-28 LAB — CBC WITH DIFFERENTIAL/PLATELET
BASOS PCT: 1 %
Basophils Absolute: 0 10*3/uL (ref 0.0–0.1)
EOS ABS: 0.1 10*3/uL (ref 0.0–0.7)
EOS PCT: 2 %
HCT: 38.1 % (ref 36.0–46.0)
Hemoglobin: 12.9 g/dL (ref 12.0–15.0)
LYMPHS ABS: 1.2 10*3/uL (ref 0.7–4.0)
Lymphocytes Relative: 22 %
MCH: 31 pg (ref 26.0–34.0)
MCHC: 33.9 g/dL (ref 30.0–36.0)
MCV: 91.6 fL (ref 78.0–100.0)
Monocytes Absolute: 0.4 10*3/uL (ref 0.1–1.0)
Monocytes Relative: 7 %
NEUTROS PCT: 70 %
Neutro Abs: 3.9 10*3/uL (ref 1.7–7.7)
PLATELETS: 204 10*3/uL (ref 150–400)
RBC: 4.16 MIL/uL (ref 3.87–5.11)
RDW: 12 % (ref 11.5–15.5)
WBC: 5.6 10*3/uL (ref 4.0–10.5)

## 2015-10-28 LAB — BASIC METABOLIC PANEL
Anion gap: 9 (ref 5–15)
BUN: 14 mg/dL (ref 6–20)
CALCIUM: 8.9 mg/dL (ref 8.9–10.3)
CO2: 27 mmol/L (ref 22–32)
CREATININE: 0.65 mg/dL (ref 0.44–1.00)
Chloride: 102 mmol/L (ref 101–111)
Glucose, Bld: 100 mg/dL — ABNORMAL HIGH (ref 65–99)
Potassium: 3.9 mmol/L (ref 3.5–5.1)
SODIUM: 138 mmol/L (ref 135–145)

## 2015-10-28 MED ORDER — MAGNESIUM SULFATE 2 GM/50ML IV SOLN
2.0000 g | Freq: Once | INTRAVENOUS | Status: AC
  Administered 2015-10-28: 2 g via INTRAVENOUS
  Filled 2015-10-28: qty 50

## 2015-10-28 MED ORDER — METHYLPREDNISOLONE SODIUM SUCC 125 MG IJ SOLR
125.0000 mg | Freq: Once | INTRAMUSCULAR | Status: AC
  Administered 2015-10-28: 125 mg via INTRAVENOUS
  Filled 2015-10-28: qty 2

## 2015-10-28 MED ORDER — KETOROLAC TROMETHAMINE 30 MG/ML IJ SOLN
15.0000 mg | Freq: Once | INTRAMUSCULAR | Status: AC
  Administered 2015-10-28: 15 mg via INTRAVENOUS

## 2015-10-28 MED ORDER — PROCHLORPERAZINE EDISYLATE 5 MG/ML IJ SOLN
10.0000 mg | Freq: Once | INTRAMUSCULAR | Status: AC
  Administered 2015-10-28: 10 mg via INTRAVENOUS
  Filled 2015-10-28: qty 2

## 2015-10-28 MED ORDER — KETOROLAC TROMETHAMINE 15 MG/ML IJ SOLN
INTRAMUSCULAR | Status: AC
  Administered 2015-10-28: 15 mg
  Filled 2015-10-28: qty 1

## 2015-10-28 MED ORDER — METHOCARBAMOL 500 MG PO TABS: 1000.0000 mg | ORAL_TABLET | Freq: Four times a day (QID) | ORAL | Status: DC | PRN

## 2015-10-28 MED ORDER — SODIUM CHLORIDE 0.9 % IV BOLUS (SEPSIS)
1000.0000 mL | Freq: Once | INTRAVENOUS | Status: AC
  Administered 2015-10-28: 1000 mL via INTRAVENOUS

## 2015-10-28 NOTE — Discharge Instructions (Signed)
Do not participate in any sports or any activities that could result in head trauma until you are cleared by your pediatrician,  primary care physician or neurologist.   For pain control you may take up to  of Motrin (also known as ibuprofen). That is usually 4 over the counter pills,  3 times a day. Take with food to minimize stomach irritation   You can also take  tylenol (acetaminophen)  (this is 3 over the counter pills) four times a day. Do not drink alcohol or combine with other medications that have acetaminophen as an ingredient (Read the labels!).    For breakthrough pain you may take Robaxin. Do not drink alcohol, drive or operate heavy machinery when taking Robaxin.  Please follow with your primary care doctor in the next 2 days for a check-up. They must obtain records for further management.   Do not hesitate to return to the Emergency Department for any new, worsening or concerning symptoms.   Concussion, Adult A concussion, or closed-head injury, is a brain injury caused by a direct blow to the head or by a quick and sudden movement (jolt) of the head or neck. Concussions are usually not life-threatening. Even so, the effects of a concussion can be serious. If you have had a concussion before, you are more likely to experience concussion-like symptoms after a direct blow to the head.  CAUSES  Direct blow to the head, such as from running into another player during a soccer game, being hit in a fight, or hitting your head on a hard surface.  A jolt of the head or neck that causes the brain to move back and forth inside the skull, such as in a car crash. SIGNS AND SYMPTOMS The signs of a concussion can be hard to notice. Early on, they may be missed by you, family members, and health care providers. You may look fine but act or feel differently. Symptoms are usually temporary, but they may last for days, weeks, or even longer. Some symptoms may appear right away while  others may not show up for hours or days. Every head injury is different. Symptoms include:  Mild to moderate headaches that will not go away.  A feeling of pressure inside your head.  Having more trouble than usual:  Learning or remembering things you have heard.  Answering questions.  Paying attention or concentrating.  Organizing daily tasks.  Making decisions and solving problems.  Slowness in thinking, acting or reacting, speaking, or reading.  Getting lost or being easily confused.  Feeling tired all the time or lacking energy (fatigued).  Feeling drowsy.  Sleep disturbances.  Sleeping more than usual.  Sleeping less than usual.  Trouble falling asleep.  Trouble sleeping (insomnia).  Loss of balance or feeling lightheaded or dizzy.  Nausea or vomiting.  Numbness or tingling.  Increased sensitivity to:  Sounds.  Lights.  Distractions.  Vision problems or eyes that tire easily.  Diminished sense of taste or smell.  Ringing in the ears.  Mood changes such as feeling sad or anxious.  Becoming easily irritated or angry for little or no reason.  Lack of motivation.  Seeing or hearing things other people do not see or hear (hallucinations). DIAGNOSIS Your health care provider can usually diagnose a concussion based on a description of your injury and symptoms. He or she will ask whether you passed out (lost consciousness) and whether you are having trouble remembering events that happened right before and during your  injury. Your evaluation might include:  A brain scan to look for signs of injury to the brain. Even if the test shows no injury, you may still have a concussion.  Blood tests to be sure other problems are not present. TREATMENT  Concussions are usually treated in an emergency department, in urgent care, or at a clinic. You may need to stay in the hospital overnight for further treatment.  Tell your health care provider if you are  taking any medicines, including prescription medicines, over-the-counter medicines, and natural remedies. Some medicines, such as blood thinners (anticoagulants) and aspirin, may increase the chance of complications. Also tell your health care provider whether you have had alcohol or are taking illegal drugs. This information may affect treatment.  Your health care provider will send you home with important instructions to follow.  How fast you will recover from a concussion depends on many factors. These factors include how severe your concussion is, what part of your brain was injured, your age, and how healthy you were before the concussion.  Most people with mild injuries recover fully. Recovery can take time. In general, recovery is slower in older persons. Also, persons who have had a concussion in the past or have other medical problems may find that it takes longer to recover from their current injury. HOME CARE INSTRUCTIONS General Instructions  Carefully follow the directions your health care provider gave you.  Only take over-the-counter or prescription medicines for pain, discomfort, or fever as directed by your health care provider.  Take only those medicines that your health care provider has approved.  Do not drink alcohol until your health care provider says you are well enough to do so. Alcohol and certain other drugs may slow your recovery and can put you at risk of further injury.  If it is harder than usual to remember things, write them down.  If you are easily distracted, try to do one thing at a time. For example, do not try to watch TV while fixing dinner.  Talk with family members or close friends when making important decisions.  Keep all follow-up appointments. Repeated evaluation of your symptoms is recommended for your recovery.  Watch your symptoms and tell others to do the same. Complications sometimes occur after a concussion. Older adults with a brain injury  may have a higher risk of serious complications, such as a blood clot on the brain.  Tell your teachers, school nurse, school counselor, coach, athletic trainer, or work Production designer, theatre/television/film about your injury, symptoms, and restrictions. Tell them about what you can or cannot do. They should watch for:  Increased problems with attention or concentration.  Increased difficulty remembering or learning new information.  Increased time needed to complete tasks or assignments.  Increased irritability or decreased ability to cope with stress.  Increased symptoms.  Rest. Rest helps the brain to heal. Make sure you:  Get plenty of sleep at night. Avoid staying up late at night.  Keep the same bedtime hours on weekends and weekdays.  Rest during the day. Take daytime naps or rest breaks when you feel tired.  Limit activities that require a lot of thought or concentration. These include:  Doing homework or job-related work.  Watching TV.  Working on the computer.  Avoid any situation where there is potential for another head injury (football, hockey, soccer, basketball, martial arts, downhill snow sports and horseback riding). Your condition will get worse every time you experience a concussion. You should avoid these  activities until you are evaluated by the appropriate follow-up health care providers. Returning To Your Regular Activities You will need to return to your normal activities slowly, not all at once. You must give your body and brain enough time for recovery.  Do not return to sports or other athletic activities until your health care provider tells you it is safe to do so.  Ask your health care provider when you can drive, ride a bicycle, or operate heavy machinery. Your ability to react may be slower after a brain injury. Never do these activities if you are dizzy.  Ask your health care provider about when you can return to work or school. Preventing Another Concussion It is very  important to avoid another brain injury, especially before you have recovered. In rare cases, another injury can lead to permanent brain damage, brain swelling, or death. The risk of this is greatest during the first 7-10 days after a head injury. Avoid injuries by:  Wearing a seat belt when riding in a car.  Drinking alcohol only in moderation.  Wearing a helmet when biking, skiing, skateboarding, skating, or doing similar activities.  Avoiding activities that could lead to a second concussion, such as contact or recreational sports, until your health care provider says it is okay.  Taking safety measures in your home.  Remove clutter and tripping hazards from floors and stairways.  Use grab bars in bathrooms and handrails by stairs.  Place non-slip mats on floors and in bathtubs.  Improve lighting in dim areas. SEEK MEDICAL CARE IF:  You have increased problems paying attention or concentrating.  You have increased difficulty remembering or learning new information.  You need more time to complete tasks or assignments than before.  You have increased irritability or decreased ability to cope with stress.  You have more symptoms than before. Seek medical care if you have any of the following symptoms for more than 2 weeks after your injury:  Lasting (chronic) headaches.  Dizziness or balance problems.  Nausea.  Vision problems.  Increased sensitivity to noise or light.  Depression or mood swings.  Anxiety or irritability.  Memory problems.  Difficulty concentrating or paying attention.  Sleep problems.  Feeling tired all the time. SEEK IMMEDIATE MEDICAL CARE IF:  You have severe or worsening headaches. These may be a sign of a blood clot in the brain.  You have weakness (even if only in one hand, leg, or part of the face).  You have numbness.  You have decreased coordination.  You vomit repeatedly.  You have increased sleepiness.  One pupil is  larger than the other.  You have convulsions.  You have slurred speech.  You have increased confusion. This may be a sign of a blood clot in the brain.  You have increased restlessness, agitation, or irritability.  You are unable to recognize people or places.  You have neck pain.  It is difficult to wake you up.  You have unusual behavior changes.  You lose consciousness. MAKE SURE YOU:  Understand these instructions.  Will watch your condition.  Will get help right away if you are not doing well or get worse.   This information is not intended to replace advice given to you by your health care provider. Make sure you discuss any questions you have with your health care provider.   Document Released: 12/10/2003 Document Revised: 10/10/2014 Document Reviewed: 04/11/2013 Elsevier Interactive Patient Education [employer]

## 2015-10-28 NOTE — ED Notes (Signed)
Pt reports that Sunday she was walking when a tree limb fell from a tree and hit the top of her head - pt reports hematoma to site, headache since the event, today developed nausea and blurry vision.

## 2015-10-28 NOTE — ED Notes (Signed)
Pt given ginger ale = denies nausea, reports feeling better.

## 2015-10-28 NOTE — ED Provider Notes (Signed)
CSN: [id number]     Arrival date & time 10/28/15  1429 History   First MD Initiated Contact with Patient 10/28/15 1455     Chief Complaint  Patient presents with  . Headache     (Consider location/radiation/quality/duration/timing/severity/associated sxs/prior Treatment) HPI  Blood pressure 140/79, pulse 70, temperature 97.8 F (36.6 C), temperature source Oral, resp. rate 16, height  (1.549 m), weight 56.246 kg, SpO2 98 %.  [name] is a 45 y.o. female complaining of headache onset 3 days ago when she was walking in the woods with her dog and her boyfriend and a tree branches switched back (it did not fall on her note that this contradicts triage note) hitting the top part of her head. Patient threw her head back to try to avoid it but there contact was made. She did not lose consciousness, she denies blood thinners, she's had 8 out of 10 headache described as right-sided temporal and frontal since then, she took Aleve with little relief. She developed blurred vision yesterday with associated nausea and single episode of nonbloody, nonbilious, non-coffee ground emesis several hours ago.   Past Medical History  Diagnosis Date  . PONV (postoperative nausea and vomiting)   . Medical history non-contributory   . Depression   . DUB (dysfunctional uterine bleeding)    Past Surgical History  Procedure Laterality Date  . Dilation and curettage of uterus    . Dilitation & currettage/hystroscopy with novasure ablation N/A 08/26/2013    Procedure: DILATATION & CURETTAGE/HYSTEROSCOPY WITH NOVASURE ABLATION;  Surgeon: Lenoard Aden, MD;  Location: [facility];  Service: Gynecology;  Laterality: N/A;  . Laparoscopic tubal ligation Bilateral 08/26/2013    Procedure: LAPAROSCOPIC TUBAL LIGATION;  Surgeon: Lenoard Aden, MD;  Location: [facility];  Service: Gynecology;  Laterality: Bilateral;  . Tubal ligation  08/2013  . Cesarean section      1997 & 2000   Family History  Problem  Relation Age of Onset  . Uterine cancer Mother   . Throat cancer Father     cigar smoker  . Cancer Father     throat and prostate  . Cancer Maternal Aunt 44    breast   Social History  Substance Use Topics  . Smoking status: Never Smoker   . Smokeless tobacco: Never Used  . Alcohol Use: 1.8 - 2.4 oz/week    3-4 Glasses of wine per week     Comment: occasional   OB History    No data available     Review of Systems  10 systems reviewed and found to be negative, except as noted in the HPI.   Allergies  Review of patient's allergies indicates no known allergies.  Home Medications   Prior to Admission medications   Medication Sig Start Date End Date Taking? Authorizing Provider  ALPRAZolam Prudy Feeler) 0.5 MG tablet Take 1 tablet (0.5 mg total) by mouth 3 (three) times daily as needed. 09/11/15  Yes Sandford Craze, NP  citalopram (CELEXA) 40 MG tablet Take 1 tablet (40 mg total) by mouth daily. 09/24/15  Yes Sandford Craze, NP  venlafaxine XR (EFFEXOR XR) 37.5 MG 24 hr capsule One tab by mouth once daily for 3 days, then increase to two tabs once daily 06/22/15  Yes Sandford Craze, NP   BP 140/79 mmHg  Pulse 70  Temp(Src) 97.8 F (36.6 C) (Oral)  Resp 16  Ht  (1.549 m)  Wt 56.246 kg  BMI 23.44 kg/m2  SpO2 98% Physical Exam  Constitutional: She is oriented to person, place, and time. She appears well-developed and well-nourished. No distress.  HENT:  Head: Normocephalic and atraumatic.  Mouth/Throat: Oropharynx is clear and moist.  No abrasions or contusions.   No hemotympanum, battle signs or raccoon's eyes  No crepitance or tenderness to palpation along the orbital rim.  EOMI intact with no pain or diplopia  No abnormal otorrhea or rhinorrhea. Nasal septum midline.  No intraoral trauma.   Eyes: Conjunctivae and EOM are normal. Pupils are equal, round, and reactive to light.  No TTP of maxillary or frontal sinuses  No TTP or induration of  temporal arteries bilaterally  Neck: Normal range of motion. Neck supple.    + midline C-spine  tenderness to palpation No step-offs appreciated.  Grip strength, biceps, triceps 5/5 bilaterally;  can differentiate between pinprick and light touch bilaterally.   No anteriolateral hematomas/bruits    Cardiovascular: Normal rate, regular rhythm and intact distal pulses.   Pulmonary/Chest: Effort normal and breath sounds normal. No stridor. No respiratory distress. She has no wheezes. She has no rales. She exhibits no tenderness.  Abdominal: Soft. Bowel sounds are normal. There is no tenderness.  Musculoskeletal: Normal range of motion. She exhibits no edema or tenderness.  Neurological: She is alert and oriented to person, place, and time. No cranial nerve deficit.  II-Visual fields grossly intact. III/IV/VI-Extraocular movements intact.  Pupils reactive bilaterally. V/VII-Smile symmetric, equal eyebrow raise,  facial sensation intact VIII- Hearing grossly intact IX/X-Normal gag XI-bilateral shoulder shrug XII-midline tongue extension Motor: 5/5 bilaterally with normal tone and bulk Cerebellar: Normal finger-to-nose  and normal heel-to-shin test.   Romberg negative Ambulates with a coordinated gait   Psychiatric: She has a normal mood and affect.  Nursing note and vitals reviewed.   ED Course  Procedures (including critical care time) Labs Review Labs Reviewed  BASIC METABOLIC PANEL - Abnormal; Notable for the following:    Glucose, Bld 100 (*)    All other components within normal limits  CBC WITH DIFFERENTIAL/PLATELET    Imaging Review No results found. I have personally reviewed and evaluated these images and lab results as part of my medical decision-making.   EKG Interpretation None      MDM   Final diagnoses:  Concussion, without loss of consciousness, initial encounter  Cervical strain, acute, initial encounter    Filed Vitals:   10/28/15 1434  BP:  140/79  Pulse: 70  Temp: 97.8 F (36.6 C)  TempSrc: Oral  Resp: 16  Height:  (1.549 m)  Weight: 56.246 kg  SpO2: 98%    Medications  sodium chloride 0.9 % bolus 1,000 mL (0 mLs Intravenous Stopped 10/28/15 1700)  prochlorperazine (COMPAZINE) injection 10 mg (10 mg Intravenous Given 10/28/15 1537)  methylPREDNISolone sodium succinate (SOLU-MEDROL) 125 mg/2 mL injection 125 mg (125 mg Intravenous Given 10/28/15 1535)  magnesium sulfate IVPB 2 g 50 mL (0 g Intravenous Stopped 10/28/15 1700)  ketorolac (TORADOL) 30 MG/ML injection 15 mg (15 mg Intravenous Given by Other 10/28/15 1723)  ketorolac (TORADOL) 15 MG/ML injection (15 mg  Given 10/28/15 1629)    Adira Limburg is 45 y.o. female presenting with headache and blurred vision after a tree limb swung back and hit her on the head 3 days ago. My neuro exam is nonfocal, there is no signs of overt head trauma. CT is negative, patient given concussion precautions and advised to follow closely with primary care.  Evaluation does not show pathology that would require ongoing emergent  intervention or inpatient treatment. Pt is hemodynamically stable and mentating appropriately. Discussed findings and plan with patient/guardian, who agrees with care plan. All questions answered. Return precautions discussed and outpatient follow up given.   New Prescriptions   METHOCARBAMOL (ROBAXIN) 500 MG TABLET    Take 2 tablets (1,000 mg total) by mouth 4 (four) times daily as needed (Pain).         Wynetta Emery, PA-C 10/28/15 1731  Melene Plan, DO 10/28/15 2330

## 2015-12-16 ENCOUNTER — Telehealth: Payer: Self-pay | Admitting: Family

## 2015-12-16 NOTE — Telephone Encounter (Signed)
[phone] [place] [place] [facility] [address] [facility] [address]  Reason for call: pt needing refill on alprazolam. Has 2 left. Pt needing refill on citalopram. She took the last one today.  Advised pt to give 3-5 day notice in the future.

## 2015-12-17 MED ORDER — ALPRAZOLAM 0.5 MG PO TABS: 0.5000 mg | ORAL_TABLET | Freq: Three times a day (TID) | ORAL | Status: DC | PRN

## 2015-12-17 MED ORDER — CITALOPRAM HYDROBROMIDE 40 MG PO TABS: 40.0000 mg | ORAL_TABLET | Freq: Every day | ORAL | Status: DC

## 2015-12-17 NOTE — Telephone Encounter (Signed)
Ok to send refill but pt due for follow up please.

## 2015-12-17 NOTE — Telephone Encounter (Signed)
Citalopram filled to pharmacy as requested.  Please advise alprazolam:  Last OV: 06/22/15 Last filled: 09/11/15, #45, 0 RF  Sig: Take 1 tablet (0.5 mg total) by mouth 3 (three) times daily as needed. UDS: 06/22/15, moderate risk

## 2015-12-17 NOTE — Telephone Encounter (Signed)
Medication called in to pharmacy as requested. LMOM for pt to call office to schedule follow up appt for anxiety/depression.

## 2015-12-22 ENCOUNTER — Ambulatory Visit (INDEPENDENT_AMBULATORY_CARE_PROVIDER_SITE_OTHER): Payer: Managed Care, Other (non HMO) | Admitting: Family

## 2015-12-22 ENCOUNTER — Encounter: Payer: Self-pay | Admitting: Family

## 2015-12-22 VITALS — BP 108/58 | HR 70 | Temp 98.1°F | Resp 20 | Ht 61.25 in | Wt 132.0 lb

## 2015-12-22 DIAGNOSIS — L989 Disorder of the skin and subcutaneous tissue, unspecified: Secondary | ICD-10-CM | POA: Diagnosis not present

## 2015-12-22 DIAGNOSIS — F4323 Adjustment disorder with mixed anxiety and depressed mood: Secondary | ICD-10-CM

## 2015-12-22 NOTE — Patient Instructions (Signed)
Complete urine prior to leaving (UDS).   Continue citalopram daily.  You may use the xanax as needed. Follow up in 6 months.

## 2015-12-22 NOTE — Assessment & Plan Note (Signed)
Depression seems stable. Still having some anxiety.  I advised pt that I do not think we should d/c citalopram at this time since she is still having regular issues with anxiety and requiring daily xanax.  I did advise her to work on diet and exercise and let me know if she has any further weight gain.  Pt verbalizes understanding. Advised pt on good sleep hygiene, ok to use xanax hx prn.

## 2015-12-22 NOTE — Progress Notes (Signed)
Subjective:    Patient ID: [name], female    DOB: 1971/05/29, 45 y.o.   MRN: [id number]  HPI  Ms. [name] is a 45 yr old female who presents today for follow up of anxiety/depression.  Pt reports that she has not used effexor in 2 months.  She is maintained on citalopram.  Pt reports that she is using 1/2 tab of 0.5. Trouble sleeping- sometimes falls asleep on the couch, then wakes up goes to bed and can't fall back to sleep.   Reports that she does get intermittent panic attacks- sometimes occurs when she is in a crowded store.  She did feel that her anxiety was a bit better controlled when she was on the effexor. Denies any symptoms of depression.  Has recently stopped working out and feels like this is the cause of her recent weight gain.   Wt Readings from Last 3 Encounters:  12/22/15 132 lb (59.875 kg)  10/28/15 124 lb (56.246 kg)  06/22/15 122 lb 3.2 oz (55.43 kg)      Review of Systems    see HPI  Past Medical History  Diagnosis Date  . PONV (postoperative nausea and vomiting)   . Medical history non-contributory   . Depression   . DUB (dysfunctional uterine bleeding)     Social History   Social History  . Marital Status: Married    Spouse Name: N/A  . Number of Children: N/A  . Years of Education: N/A   Occupational History  . Not on file.   Social History Main Topics  . Smoking status: Never Smoker   . Smokeless tobacco: Never Used  . Alcohol Use: 1.8 - 2.4 oz/week    3-4 Glasses of wine per week     Comment: occasional  . Drug Use: No  . Sexual Activity: Not on file   Other Topics Concern  . 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 Dilation and curettage of uterus    . Dilitation & currettage/hystroscopy with novasure ablation N/A 08/26/2013    Procedure: DILATATION & CURETTAGE/HYSTEROSCOPY WITH NOVASURE ABLATION;  Surgeon: Lenoard Aden, MD;   Location: [facility];  Service: Gynecology;  Laterality: N/A;  . Laparoscopic tubal ligation Bilateral 08/26/2013    Procedure: LAPAROSCOPIC TUBAL LIGATION;  Surgeon: Lenoard Aden, MD;  Location: [facility];  Service: Gynecology;  Laterality: Bilateral;  . Tubal ligation  08/2013  . Cesarean section      1997 & 2000    Family History  Problem Relation Age of Onset  . Uterine cancer Mother   . Throat cancer Father     cigar smoker  . Cancer Father     throat and prostate  . Cancer Maternal Aunt 75    breast    No Known Allergies  Current Outpatient Prescriptions on File Prior to Visit  Medication Sig Dispense Refill  . ALPRAZolam (XANAX) 0.5 MG tablet Take 1 tablet (0.5 mg total) by mouth 3 (three) times daily as needed. 45 tablet 0  . citalopram (CELEXA) 40 MG tablet Take 1 tablet (40 mg total) by mouth daily. 90 tablet 0  . methocarbamol (ROBAXIN) 500 MG tablet Take 2 tablets (1,000 mg total) by mouth 4 (four) times daily as needed (Pain). 20 tablet 0   No current facility-administered medications on file prior to visit.    BP 108/58 mmHg  Pulse 70  Temp(Src) 98.1 F (36.7 C) (Oral)  Resp 20  Ht 5' 1.25" (1.556 m)  Wt 132 lb (59.875 kg)  BMI 24.73 kg/m2  SpO2 99%    Objective:   Physical Exam  Constitutional: She is oriented to person, place, and time. She appears well-developed and well-nourished.  Neurological: She is alert and oriented to person, place, and time.  Psychiatric: She has a normal mood and affect. Her behavior is normal. Judgment and thought content normal.          Assessment & Plan:  15 minutes spent with pt.  >50% of this time was spent counseling patient on anxiety and treatment. UDS today.

## 2015-12-22 NOTE — Progress Notes (Signed)
Pre visit review using our clinic review tool, if applicable. No additional management support is needed unless otherwise documented below in the visit note. 

## 2016-04-11 ENCOUNTER — Other Ambulatory Visit: Payer: Self-pay | Admitting: Family

## 2016-04-11 MED ORDER — CITALOPRAM HYDROBROMIDE 40 MG PO TABS: 40.0000 mg | ORAL_TABLET | Freq: Every day | ORAL | Status: DC

## 2016-04-11 NOTE — Telephone Encounter (Signed)
°  Relationship to patient: Self Can be reached: [phone]  [facility]:  [phone] [place], [place] - [address] [facility] [address] [phone] (Phone) [phone] (Fax)         Reason for call: Request refill on citalopram (CELEXA) 40 MG tablet

## 2016-04-11 NOTE — Telephone Encounter (Signed)
Rx sent 

## 2016-08-09 ENCOUNTER — Telehealth: Payer: Self-pay | Admitting: Family

## 2016-08-09 NOTE — Telephone Encounter (Signed)
Caller name: Cala BradfordKimberly Relationship to patient: self Can be reached:  [phone] Pharmacy: [phone] - [place], [place] - [address] [facility] [address]  Reason for call: pt needs celexa refill. Has 2 left. Pt is scheduled to come in 11/15.

## 2016-08-10 MED ORDER — CITALOPRAM HYDROBROMIDE 40 MG PO TABS: 40.0000 mg | ORAL_TABLET | Freq: Every day | ORAL | 0 refills | Status: DC

## 2016-08-10 NOTE — Telephone Encounter (Signed)
Rx sent 

## 2016-08-10 NOTE — Telephone Encounter (Signed)
Notified pt. 

## 2016-08-17 ENCOUNTER — Encounter: Payer: Self-pay | Admitting: Family

## 2016-08-17 ENCOUNTER — Ambulatory Visit (INDEPENDENT_AMBULATORY_CARE_PROVIDER_SITE_OTHER): Payer: Managed Care, Other (non HMO) | Admitting: Family

## 2016-08-17 DIAGNOSIS — F4323 Adjustment disorder with mixed anxiety and depressed mood: Secondary | ICD-10-CM | POA: Diagnosis not present

## 2016-08-17 MED ORDER — CITALOPRAM HYDROBROMIDE 10 MG PO TABS: ORAL_TABLET | ORAL | 0 refills | Status: DC

## 2016-08-17 MED ORDER — VENLAFAXINE HCL ER 37.5 MG PO CP24: ORAL_CAPSULE | ORAL | 2 refills | Status: DC

## 2016-08-17 NOTE — Patient Instructions (Addendum)
Please taper citalopram as follows up:  Citalopram 10mg  tabs:  3 tabs once daily for 2 weeks, 2 tabs daily for 1 week, 1 tab daily for 1 week, then 1/2 tab daily for 1 week then stop Begin Effexor xr 37.5mg  once daily for 3 days, then increase to 2 tabs once daily.

## 2016-08-17 NOTE — Progress Notes (Signed)
Pre visit review using our clinic review tool, if applicable. No additional management support is needed unless otherwise documented below in the visit note. 

## 2016-08-17 NOTE — Assessment & Plan Note (Signed)
Stable. She is concerned about weight gain. Will plan to wean off of citalopram and add back effexor as outlined in AVS.  Pt is advised to call if she has any issues during the transition. 15 min spent with pt today. >50% of this time was spent counseling pt on anxiety/depression.

## 2016-08-17 NOTE — Progress Notes (Signed)
Subjective:    Patient ID: [name], female    DOB: [birth year], 45 y.o.   MRN: [id number]  HPI   [name] is a 45 yr old female who presents today for follow up.  Anxiety/Depression- currently maintained on citalopram and prn xanax.  Notes rare use of xanax. She reports that her symptoms are well controlled but that she does not feel that her anxiety is at a point that she could discontinue her medication completely. She rarely uses xanax. She reports that she tried to stop her citalopram 40mg  cold [place] and about 4 days after discontinuing she felt terrible so she restarted.  She is concerned about her significant weight gain. She finalized her divorce in the spring and is now dating again.   Wt Readings from Last 3 Encounters:  08/17/16 142 lb 12.8 oz (64.8 kg)  12/22/15 132 lb (59.9 kg)  10/28/15 124 lb (56.2 kg)   Review of Systems See HPI  Past Medical History:  Diagnosis Date  . Depression   . DUB (dysfunctional uterine bleeding)   . Medical history non-contributory   . PONV (postoperative nausea and vomiting)      Social History   Social History  . Marital status: Married    Spouse name: N/A  . Number of children: N/A  . Years of education: N/A   Occupational History  . Not on file.   Social History Main Topics  . Smoking status: Never Smoker  . Smokeless tobacco: Never Used  . Alcohol use 1.8 - 2.4 oz/week    3 - 4 Glasses of wine per week     Comment: occasional  . Drug use: No  . Sexual activity: Not on file   Other Topics Concern  . 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 CESAREAN SECTION     1997 & 2000  . DILATION AND CURETTAGE OF UTERUS    . DILITATION & CURRETTAGE/HYSTROSCOPY WITH NOVASURE ABLATION N/A 08/26/2013   Procedure: DILATATION & CURETTAGE/HYSTEROSCOPY WITH NOVASURE ABLATION;  Surgeon: Lenoard Adenichard J Taavon, MD;  Location: [facility];  Service:  Gynecology;  Laterality: N/A;  . LAPAROSCOPIC TUBAL LIGATION Bilateral 08/26/2013   Procedure: LAPAROSCOPIC TUBAL LIGATION;  Surgeon: Lenoard Adenichard J Taavon, MD;  Location: [facility];  Service: Gynecology;  Laterality: Bilateral;  . TUBAL LIGATION  08/2013    Family History  Problem Relation Age of Onset  . Uterine cancer Mother   . Throat cancer Father     cigar smoker  . Cancer Father     throat and prostate  . Cancer Maternal Aunt 75    breast    No Known Allergies  Current Outpatient Prescriptions on File Prior to Visit  Medication Sig Dispense Refill  . ALPRAZolam (XANAX) 0.5 MG tablet Take 1 tablet (0.5 mg total) by mouth 3 (three) times daily as needed. 45 tablet 0  . citalopram (CELEXA) 40 MG tablet Take 1 tablet (40 mg total) by mouth daily. 90 tablet 0  . methocarbamol (ROBAXIN) 500 MG tablet Take 2 tablets (1,000 mg total) by mouth 4 (four) times daily as needed (Pain). 20 tablet 0   No current facility-administered medications on file prior to visit.     BP 127/70 (BP Location: Left Arm, Patient Position: Sitting, Cuff Size: Normal)   Pulse 71   Temp 98.3 F (36.8 C) (Oral)   Resp 18  Ht 5\' 1"  (1.549 m)   Wt 142 lb 12.8 oz (64.8 kg)   SpO2 100% Comment: RA  BMI 26.98 kg/m       Objective:   Physical Exam  Constitutional: She is oriented to person, place, and time. She appears well-developed and well-nourished.  HENT:  Head: Normocephalic and atraumatic.  Musculoskeletal: She exhibits no edema.  Neurological: She is alert and oriented to person, place, and time.  Psychiatric: She has a normal mood and affect. Her behavior is normal. Judgment and thought content normal.          Assessment & Plan:

## 2016-09-23 ENCOUNTER — Ambulatory Visit (INDEPENDENT_AMBULATORY_CARE_PROVIDER_SITE_OTHER): Payer: Managed Care, Other (non HMO) | Admitting: Family

## 2016-09-23 ENCOUNTER — Encounter: Payer: Self-pay | Admitting: Family

## 2016-09-23 VITALS — BP 126/75 | HR 71 | Temp 97.8°F | Resp 18 | Ht 61.0 in | Wt 143.0 lb

## 2016-09-23 DIAGNOSIS — R5383 Other fatigue: Secondary | ICD-10-CM | POA: Diagnosis not present

## 2016-09-23 DIAGNOSIS — F4323 Adjustment disorder with mixed anxiety and depressed mood: Secondary | ICD-10-CM | POA: Diagnosis not present

## 2016-09-23 LAB — TSH: TSH: 1.19 u[IU]/mL (ref 0.35–4.50)

## 2016-09-23 LAB — CBC WITH DIFFERENTIAL/PLATELET
BASOS PCT: 1.1 % (ref 0.0–3.0)
Basophils Absolute: 0 10*3/uL (ref 0.0–0.1)
EOS ABS: 0.1 10*3/uL (ref 0.0–0.7)
EOS PCT: 2.3 % (ref 0.0–5.0)
HCT: 41.4 % (ref 36.0–46.0)
Hemoglobin: 14.5 g/dL (ref 12.0–15.0)
LYMPHS ABS: 1 10*3/uL (ref 0.7–4.0)
Lymphocytes Relative: 27.4 % (ref 12.0–46.0)
MCHC: 35.1 g/dL (ref 30.0–36.0)
MCV: 89.6 fl (ref 78.0–100.0)
MONO ABS: 0.3 10*3/uL (ref 0.1–1.0)
Monocytes Relative: 8.4 % (ref 3.0–12.0)
NEUTROS PCT: 60.8 % (ref 43.0–77.0)
Neutro Abs: 2.2 10*3/uL (ref 1.4–7.7)
PLATELETS: 227 10*3/uL (ref 150.0–400.0)
RBC: 4.63 Mil/uL (ref 3.87–5.11)
RDW: 12.7 % (ref 11.5–15.5)
WBC: 3.6 10*3/uL — AB (ref 4.0–10.5)

## 2016-09-23 MED ORDER — VENLAFAXINE HCL ER 75 MG PO CP24: 75.0000 mg | ORAL_CAPSULE | Freq: Every day | ORAL | 5 refills | Status: DC

## 2016-09-23 NOTE — Patient Instructions (Signed)
Please complete lab work prior to leaving.   

## 2016-09-23 NOTE — Progress Notes (Signed)
Subjective:    Patient ID: [name], female    DOB: 07-30-1971, 45 y.o.   MRN: [id number]  HPI  Ms. [name] is a 45 yr old female who presents today for follow up of her depression/anxiety. Last visit she reported concern re: weight gain.  She transitioned off of citalopram and onto effexor. She reports that she is feeling better on the effexor.  She does report some fatigue which she attributes to working too much. Weight has been stable with the transition, notes some dietary indiscretion due to the holidays.   Wt Readings from Last 3 Encounters:  09/23/16 143 lb (64.9 kg)  08/17/16 142 lb 12.8 oz (64.8 kg)  12/22/15 132 lb (59.9 kg)      Review of Systems See HPI  Past Medical History:  Diagnosis Date  . Depression   . DUB (dysfunctional uterine bleeding)   . Medical history non-contributory   . PONV (postoperative nausea and vomiting)      Social History   Social History  . Marital status: Married    Spouse name: N/A  . Number of children: N/A  . Years of education: N/A   Occupational History  . Not on file.   Social History Main Topics  . Smoking status: Never Smoker  . Smokeless tobacco: Never Used  . Alcohol use 1.8 - 2.4 oz/week    3 - 4 Glasses of wine per week     Comment: occasional  . Drug use: No  . Sexual activity: Not on file   Other Topics Concern  . 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 CESAREAN SECTION     1997 & 2000  . DILATION AND CURETTAGE OF UTERUS    . DILITATION & CURRETTAGE/HYSTROSCOPY WITH NOVASURE ABLATION N/A 08/26/2013   Procedure: DILATATION & CURETTAGE/HYSTEROSCOPY WITH NOVASURE ABLATION;  Surgeon: Lenoard Adenichard J Taavon, MD;  Location: [facility];  Service: Gynecology;  Laterality: N/A;  . LAPAROSCOPIC TUBAL LIGATION Bilateral 08/26/2013   Procedure: LAPAROSCOPIC TUBAL LIGATION;  Surgeon: Lenoard Adenichard J Taavon, MD;  Location: [facility];   Service: Gynecology;  Laterality: Bilateral;  . TUBAL LIGATION  08/2013    Family History  Problem Relation Age of Onset  . Uterine cancer Mother   . Throat cancer Father     cigar smoker  . Cancer Father     throat and prostate  . Cancer Maternal Aunt 75    breast    No Known Allergies  Current Outpatient Prescriptions on File Prior to Visit  Medication Sig Dispense Refill  . ALPRAZolam (XANAX) 0.5 MG tablet Take 1 tablet (0.5 mg total) by mouth 3 (three) times daily as needed. 45 tablet 0   No current facility-administered medications on file prior to visit.     BP 126/75 (BP Location: Right Arm, Cuff Size: Normal)   Pulse 71   Temp 97.8 F (36.6 C) (Oral)   Resp 18   Ht 5\' 1"  (1.549 m)   Wt 143 lb (64.9 kg)   SpO2 100% Comment: room air  BMI 27.02 kg/m       Objective:   Physical Exam  Constitutional: She is oriented to person, place, and time. She appears well-developed and well-nourished.  HENT:  Head: Normocephalic and atraumatic.  Cardiovascular: Normal rate, regular rhythm and normal heart sounds.   No murmur heard. Pulmonary/Chest: Effort normal and breath sounds  normal. No respiratory distress. She has no wheezes.  Neurological: She is alert and oriented to person, place, and time.  Psychiatric: She has a normal mood and affect. Her behavior is normal. Judgment and thought content normal.          Assessment & Plan:  Fatigue- will check cbc and tsh.

## 2016-09-23 NOTE — Addendum Note (Signed)
Addended by: Eustace QuailEABOLD, Bobbye Reinitz J on: 09/23/2016 09:52 AM   Modules accepted: Orders

## 2016-09-23 NOTE — Assessment & Plan Note (Signed)
Stable on effexor, continue same.  (I did change her to the 75mg  tab for ease of administration).

## 2016-09-23 NOTE — Progress Notes (Signed)
Pre visit review using our clinic review tool, if applicable. No additional management support is needed unless otherwise documented below in the visit note. 

## 2016-11-07 ENCOUNTER — Other Ambulatory Visit: Payer: Self-pay | Admitting: Obstetrics and Gynecology

## 2016-11-07 DIAGNOSIS — Z1231 Encounter for screening mammogram for malignant neoplasm of breast: Secondary | ICD-10-CM

## 2016-11-15 ENCOUNTER — Ambulatory Visit
Admission: RE | Admit: 2016-11-15 | Discharge: 2016-11-15 | Disposition: A | Discharge status: Home / Self Care | Payer: Managed Care, Other (non HMO) | Source: Ambulatory Visit | Attending: Obstetrics and Gynecology | Admitting: Obstetrics and Gynecology

## 2016-11-15 DIAGNOSIS — Z1231 Encounter for screening mammogram for malignant neoplasm of breast: Secondary | ICD-10-CM

## 2017-04-18 ENCOUNTER — Other Ambulatory Visit: Payer: Self-pay | Admitting: Family

## 2017-04-18 NOTE — Telephone Encounter (Signed)
Venlafaxine refill sent to pharmacy. Pt was due for 6 month f/u with Melissa in June and is now past due. Please call pt to schedule appointment before further refills are needed.

## 2017-04-19 NOTE — Telephone Encounter (Signed)
Left detailed message for pt to rtn call to schedule FU per PCP

## 2017-07-10 ENCOUNTER — Ambulatory Visit (INDEPENDENT_AMBULATORY_CARE_PROVIDER_SITE_OTHER): Payer: 59 | Admitting: Family

## 2017-07-10 ENCOUNTER — Encounter: Payer: Self-pay | Admitting: Family

## 2017-07-10 VITALS — BP 117/69 | HR 82 | Temp 98.5°F | Resp 16 | Ht 61.0 in | Wt 142.6 lb

## 2017-07-10 DIAGNOSIS — Z23 Encounter for immunization: Secondary | ICD-10-CM

## 2017-07-10 DIAGNOSIS — F4323 Adjustment disorder with mixed anxiety and depressed mood: Secondary | ICD-10-CM | POA: Diagnosis not present

## 2017-07-10 DIAGNOSIS — F419 Anxiety disorder, unspecified: Secondary | ICD-10-CM

## 2017-07-10 MED ORDER — ALPRAZOLAM 0.5 MG PO TABS: 0.5000 mg | ORAL_TABLET | Freq: Three times a day (TID) | ORAL | 0 refills | Status: DC | PRN

## 2017-07-10 MED ORDER — VENLAFAXINE HCL ER 150 MG PO CP24: 150.0000 mg | ORAL_CAPSULE | Freq: Every day | ORAL | 1 refills | Status: DC

## 2017-07-10 NOTE — Progress Notes (Signed)
Subjective:    Patient ID: [name], female    DOB: 1971/04/25, 46 y.o.   MRN: [id number]  HPI  Ms. [name] is a 46 yr old female who presents today for follow up of anxiety/depression. She reports that she will begin weight watchers.  Has a lot of stress with her job.  Has a child who is a Holiday representative in high school and one who is a Holiday representative in Automotive [employer].  She is perimenopausal. Frustrated that she is not losing weight.  Continues effexor.   Wt Readings from Last 3 Encounters:  07/10/17 142 lb 9.6 oz (64.7 kg)  09/23/16 143 lb (64.9 kg)  08/17/16 142 lb 12.8 oz (64.8 kg)    Review of Systems    see HPI  Past Medical History:  Diagnosis Date  . Depression   . DUB (dysfunctional uterine bleeding)   . Medical history non-contributory   . PONV (postoperative nausea and vomiting)      Social History   Social History  . Marital status: Married    Spouse name: N/A  . Number of children: N/A  . Years of education: N/A   Occupational History  . Not on file.   Social History Main Topics  . Smoking status: Never Smoker  . Smokeless tobacco: Never Used  . Alcohol use 1.8 - 2.4 oz/week    3 - 4 Glasses of wine per week     Comment: occasional  . Drug use: No  . Sexual activity: Not on file   Other Topics Concern  . 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 CESAREAN SECTION     1997 & 2000  . DILATION AND CURETTAGE OF UTERUS    . DILITATION & CURRETTAGE/HYSTROSCOPY WITH NOVASURE ABLATION N/A 08/26/2013   Procedure: DILATATION & CURETTAGE/HYSTEROSCOPY WITH NOVASURE ABLATION;  Surgeon: Lenoard Aden, MD;  Location: [facility];  Service: Gynecology;  Laterality: N/A;  . LAPAROSCOPIC TUBAL LIGATION Bilateral 08/26/2013   Procedure: LAPAROSCOPIC TUBAL LIGATION;  Surgeon: Lenoard Aden, MD;  Location: [facility];  Service: Gynecology;  Laterality: Bilateral;  . TUBAL LIGATION  08/2013     Family History  Problem Relation Age of Onset  . Uterine cancer Mother   . Throat cancer Father        cigar smoker  . Cancer Father        throat and prostate  . Cancer Maternal Aunt 75       breast  . Breast cancer Maternal Aunt     No Known Allergies  Current Outpatient Prescriptions on File Prior to Visit  Medication Sig Dispense Refill  . ALPRAZolam (XANAX) 0.5 MG tablet Take 1 tablet (0.5 mg total) by mouth 3 (three) times daily as needed. 45 tablet 0  . venlafaxine XR (EFFEXOR-XR) 75 MG 24 hr capsule TAKE 1 CAPSULE(75 MG) BY MOUTH DAILY WITH BREAKFAST 30 capsule 0   No current facility-administered medications on file prior to visit.     BP 117/69 (BP Location: Right Arm, Cuff Size: Normal)   Pulse 82   Temp 98.5 F (36.9 C) (Oral)   Resp 16   Ht  (1.549 m)   Wt 142 lb 9.6 oz (64.7 kg)   SpO2 100%   BMI 26.94 kg/m    Objective:   Physical Exam  Constitutional: She is oriented to person, place, and time. She  appears well-developed and well-nourished. No distress.  Neurological: She is alert and oriented to person, place, and time.  Psychiatric: Her behavior is normal. Judgment and thought content normal.  Briefly tearful           Assessment & Plan:

## 2017-07-10 NOTE — Assessment & Plan Note (Signed)
Uncontrolled. Increase effexor from  to .  Obtain UDS due to xanax.

## 2017-07-10 NOTE — Patient Instructions (Addendum)
Please increase effexor to  once daily.   Stop by the lab on the way out.

## 2017-07-13 LAB — PAIN MGMT, PROFILE 8 W/CONF, U
6 Acetylmorphine: NEGATIVE ng/mL (ref <10)
ALCOHOL METABOLITES: POSITIVE ng/mL — AB (ref <500)
AMPHETAMINES: NEGATIVE ng/mL (ref <500)
BENZODIAZEPINES: NEGATIVE ng/mL (ref <100)
Buprenorphine, Urine: NEGATIVE ng/mL (ref <5)
COCAINE METABOLITE: NEGATIVE ng/mL (ref <150)
CREATININE: 107.8 mg/dL
ETHYL GLUCURONIDE (ETG): 1448 ng/mL — AB (ref <500)
ETHYL SULFATE (ETS): 309 ng/mL — AB (ref <100)
MARIJUANA METABOLITE: NEGATIVE ng/mL (ref <20)
MDMA: NEGATIVE ng/mL (ref <500)
OXIDANT: NEGATIVE ug/mL (ref <200)
OXYCODONE: NEGATIVE ng/mL (ref <100)
Opiates: NEGATIVE ng/mL (ref <100)
PH: 5.89 (ref 4.5–9.0)

## 2017-08-20 NOTE — Progress Notes (Signed)
Subjective:    Patient ID: [name], female    DOB: Feb 01, 1971, 46 y.o.   MRN: [id number]  HPI  Pt is a 46 yr old female who presents today for follow up of her anxiety/depression. Last visit we increased effexor  from 75mg  to 150mg  due to uncontrolled symptoms. Feels more even keeled.  Reports that her hot flashes are much improved as well.   Started on weight watchers.  Wt Readings from Last 3 Encounters:  08/21/17 136 lb 9.6 oz (62 kg)  07/10/17 142 lb 9.6 oz (64.7 kg)  09/23/16 143 lb (64.9 kg)    Review of Systems See HPI    Past Medical History:  Diagnosis Date  . Depression   . DUB (dysfunctional uterine bleeding)   . Medical history non-contributory   . PONV (postoperative nausea and vomiting)      Social History   Socioeconomic History  . Marital status: Married    Spouse name: Not on file  . Number of children: Not on file  . Years of education: Not on file  . Highest education level: Not on file  Social Needs  . Financial resource strain: Not on file  . Food insecurity - worry: Not on file  . Food insecurity - inability: Not on file  . Transportation needs - medical: Not on file  . Transportation needs - non-medical: Not on file  Occupational History  . Not on file  Tobacco Use  . Smoking status: Never Smoker  . Smokeless tobacco: Never Used  Substance and Sexual Activity  . Alcohol use: Yes    Alcohol/week: 1.8 - 2.4 oz    Types: 3 - 4 Glasses of wine per week    Comment: occasional  . Drug use: No  . Sexual activity: Not on file  Other Topics Concern  . 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 CESAREAN SECTION     1997 & 2000  . DILATATION & CURETTAGE/HYSTEROSCOPY WITH NOVASURE ABLATION N/A 08/26/2013   [facility]  . DILATION AND CURETTAGE OF UTERUS    . LAPAROSCOPIC TUBAL LIGATION Bilateral 08/26/2013   [facility]formed  by [facility] Richard J[facility]  . TUBAL LIGATION  08/2013    Family History  Problem Relation Age of Onset  . Uterine cancer Mother   . Throat cancer Father        cigar smoker  . Cancer Father        throat and prostate  . Cancer Maternal Aunt 75       breast  . Breast cancer Maternal Aunt     No Known Allergies  Current Outpatient Medications on File Prior to Visit  Medication Sig Dispense Refill  . ALPRAZolam (XANAX) 0.5 MG tablet Take 1 tablet (0.5 mg total) by mouth 3 (three) times daily as needed. 45 tablet 0  . venlafaxine XR (EFFEXOR-XR) 150 MG 24 hr capsule Take 1 capsule (150 mg total) by mouth daily with breakfast. 30 capsule 1   No current facility-administered medications on file prior to visit.     BP 131/80 (BP Location: Right Arm, Cuff Size: Normal)   Pulse 74   Temp 98.2 F (36.8 C) (Oral)   Resp 16   Ht 5\' 1"  (1.549 m)   Wt 136 lb 9.6 oz (62 kg)   SpO2 100%  BMI 25.81 kg/m    Objective:   Physical Exam  Constitutional: She is oriented to person, place, and time. She appears well-developed and well-nourished.  HENT:  Head: Normocephalic and atraumatic.  Neurological: She is alert and oriented to person, place, and time.  Psychiatric: She has a normal mood and affect. Her behavior is normal. Judgment and thought content normal.          Assessment & Plan:

## 2017-08-21 ENCOUNTER — Ambulatory Visit (INDEPENDENT_AMBULATORY_CARE_PROVIDER_SITE_OTHER): Payer: 59 | Admitting: Family

## 2017-08-21 ENCOUNTER — Ambulatory Visit: Payer: Self-pay | Admitting: Family

## 2017-08-21 ENCOUNTER — Encounter: Payer: Self-pay | Admitting: Family

## 2017-08-21 DIAGNOSIS — F4323 Adjustment disorder with mixed anxiety and depressed mood: Secondary | ICD-10-CM

## 2017-08-21 MED ORDER — VENLAFAXINE HCL ER 150 MG PO CP24: 150.0000 mg | ORAL_CAPSULE | Freq: Every day | ORAL | 1 refills | Status: DC

## 2017-08-21 NOTE — Assessment & Plan Note (Addendum)
Improved on increased effexor.  Continue current dose. I encouraged pt to continue weight loss efforts.

## 2017-09-04 ENCOUNTER — Ambulatory Visit: Payer: Self-pay | Admitting: Psychology

## 2017-09-25 ENCOUNTER — Ambulatory Visit (INDEPENDENT_AMBULATORY_CARE_PROVIDER_SITE_OTHER): Payer: 59 | Admitting: Medical

## 2017-09-25 ENCOUNTER — Encounter: Payer: Self-pay | Admitting: Medical

## 2017-09-25 ENCOUNTER — Telehealth: Payer: Self-pay | Admitting: Family

## 2017-09-25 VITALS — BP 130/74 | HR 86 | Temp 98.2°F | Resp 16 | Ht 61.0 in | Wt 134.0 lb

## 2017-09-25 DIAGNOSIS — J0100 Acute maxillary sinusitis, unspecified: Secondary | ICD-10-CM

## 2017-09-25 DIAGNOSIS — J029 Acute pharyngitis, unspecified: Secondary | ICD-10-CM | POA: Diagnosis not present

## 2017-09-25 DIAGNOSIS — H9201 Otalgia, right ear: Secondary | ICD-10-CM | POA: Diagnosis not present

## 2017-09-25 MED ORDER — FLUTICASONE PROPIONATE 50 MCG/ACT NA SUSP: 2.0000 | Freq: Every day | NASAL | 1 refills | Status: DC

## 2017-09-25 MED ORDER — AMOXICILLIN-POT CLAVULANATE 875-125 MG PO TABS: 1.0000 | ORAL_TABLET | Freq: Two times a day (BID) | ORAL | 0 refills | Status: DC

## 2017-09-25 NOTE — Telephone Encounter (Signed)
Please inform Pt she will need appt.

## 2017-09-25 NOTE — Progress Notes (Signed)
Subjective:    Patient ID: [name], female    DOB: 1970/11/18, 46 y.o.   MRN: [id number]  HPI  Pt in for with some st on Saturday and some pain in her rt ear and some rt side sinus pressure. No fever, no chills or sweats. No ear infection since childhood. Some throat pain minimal. Rt ear pain is the worse.  Interestingly enough she does not report any nasal congestion.  No teeth pain reported. No rash as well.  Pt gets sinus infection every couple of years.   Review of Systems  Constitutional: Negative for chills, fatigue and fever.  HENT: Positive for ear pain, sinus pressure, sinus pain and sore throat.   Eyes: Negative for pain, redness and itching.  Respiratory: Negative for cough, chest tightness, shortness of breath and wheezing.   Cardiovascular: Negative for chest pain and palpitations.  Gastrointestinal: Negative for abdominal pain.  Genitourinary: Negative for difficulty urinating, dysuria, flank pain and frequency.  Musculoskeletal: Negative for back pain and myalgias.  Skin: Negative for rash.  Hematological: Negative for adenopathy. Does not bruise/bleed easily.  Psychiatric/Behavioral: The patient is not nervous/anxious.    Past Medical History:  Diagnosis Date  . Depression   . DUB (dysfunctional uterine bleeding)   . Medical history non-contributory   . PONV (postoperative nausea and vomiting)      Social History   Socioeconomic History  . Marital status: Married    Spouse name: Not on file  . Number of children: Not on file  . Years of education: Not on file  . Highest education level: Not on file  Social Needs  . Financial resource strain: Not on file  . Food insecurity - worry: Not on file  . Food insecurity - inability: Not on file  . Transportation needs - medical: Not on file  . Transportation needs - non-medical: Not on file  Occupational History  . Not on file  Tobacco Use  . Smoking status: Never Smoker  . Smokeless tobacco:  Never Used  Substance and Sexual Activity  . Alcohol use: Yes    Alcohol/week: 1.8 - 2.4 oz    Types: 3 - 4 Glasses of wine per week    Comment: occasional  . Drug use: No  . Sexual activity: Not on file  Other Topics Concern  . 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 CESAREAN SECTION     1997 & 2000  . DILATION AND CURETTAGE OF UTERUS    . DILITATION & CURRETTAGE/HYSTROSCOPY WITH NOVASURE ABLATION N/A 08/26/2013   Procedure: DILATATION & CURETTAGE/HYSTEROSCOPY WITH NOVASURE ABLATION;  Surgeon: Lenoard Adenichard J Taavon, MD;  Location: [facility];  Service: Gynecology;  Laterality: N/A;  . LAPAROSCOPIC TUBAL LIGATION Bilateral 08/26/2013   Procedure: LAPAROSCOPIC TUBAL LIGATION;  Surgeon: Lenoard Adenichard J Taavon, MD;  Location: [facility];  Service: Gynecology;  Laterality: Bilateral;  . TUBAL LIGATION  08/2013    Family History  Problem Relation Age of Onset  . Uterine cancer Mother   . Throat cancer Father        cigar smoker  . Cancer Father        throat and prostate  . Cancer Maternal Aunt 75       breast  . Breast cancer Maternal Aunt     No Known Allergies  Current Outpatient Medications on File Prior to Visit  Medication Sig Dispense Refill  .  ALPRAZolam (XANAX) 0.5 MG tablet Take 1 tablet (0.5 mg total) by mouth 3 (three) times daily as needed. 45 tablet 0  . venlafaxine XR (EFFEXOR-XR) 150 MG 24 hr capsule Take 1 capsule (150 mg total) daily with breakfast by mouth. 90 capsule 1   No current facility-administered medications on file prior to visit.     BP 130/74 (BP Location: Right Arm, Patient Position: Sitting, Cuff Size: Small)   Pulse 86   Temp 98.2 F (36.8 C) (Oral)   Resp 16   Ht 5\' 1"  (1.549 m)   Wt 134 lb (60.8 kg)   LMP 09/18/2017   SpO2 100%   BMI 25.32 kg/m       Objective:   Physical Exam   General  Mental Status - Alert. General Appearance - Well groomed. Not in  acute distress.  Skin Rashes- No Rashes.  HEENT Head- Normal. Ear Auditory Canal - Left- Normal. Right - Normal.Tympanic Membrane- Left- Normal. Right- Normal. Eye Sclera/Conjunctiva- Left- Normal. Right- Normal. Nose & Sinuses Nasal Mucosa- Left-  Boggy and Congested. Right-  Boggy and  Congested.Bilateral rt side  maxillary and  Rt frontal sinus pressure. Mouth & Throat Lips: Upper Lip- Normal: no dryness, cracking, pallor, cyanosis, or vesicular eruption. Lower Lip-Normal: no dryness, cracking, pallor, cyanosis or vesicular eruption. Buccal Mucosa- Bilateral- No Aphthous ulcers. Oropharynx- No Discharge or Erythema. Tonsils: Characteristics- Bilateral- Erythema rt side. Size/Enlargement- Bilateral- No enlargement. Discharge- bilateral-None.  Neck Neck- Supple. No Masses. Mild enlarged submandibular node.   Chest and Lung Exam Auscultation: Breath Sounds:-Clear even and unlabored.  Cardiovascular Auscultation:Rythm- Regular, rate and rhythm. Murmurs & Other Heart Sounds:Ausculatation of the heart reveal- No Murmurs.  Lymphatic Head & Neck General Head & Neck Lymphatics: Bilateral: Description- see neck exam.  Skin- on close inspection of skin no rash or vesicles.           Assessment & Plan:  You have sinus pressure/pain on exam.  More so right maxillary sinus region.  For sinusitis with Augmentin antibiotic.  This antibiotic can help with strep throat as well.(strep test deferred)  Your right ear tympanic membrane looks normal.  I think the pain you have is from pressure inside the eustachian tube.  Prescription of Flonase written for that.  Rest hydrate, and Tylenol or ibuprofen for pain or fever.  Follow-up in 7-10 days or as needed.  Xayne Brumbaugh, Ramon DredgeEdward, PA-C

## 2017-09-25 NOTE — Telephone Encounter (Signed)
Pt called in to request a Rx be sent to the pharmacy for her. Pt says that she have a possible ear infection, pt is experiencing some ear pain.  Advised that an apt is needed. Pt requested to have a message sent instead, pt will come in if need be.   Please advise.   Pharmacy: [facility] [phone] - [place],  [facility] [address] [facility] [address]

## 2017-09-25 NOTE — Telephone Encounter (Signed)
Pt made an appt with saguier for 09/25/17.

## 2017-09-25 NOTE — Patient Instructions (Addendum)
You have sinus pressure/pain on exam.  More so right maxillary sinus region.  For sinusitis with Augmentin antibiotic.  This antibiotic can help with strep throat as well.(strep test deferred)  Your right ear tympanic membrane looks normal.  I think the pain you have is from pressure inside the eustachian tube.  Prescription of Flonase written for that.  Rest hydrate, and Tylenol or ibuprofen for pain or fever.  Follow-up in 7-10 days or as needed.

## 2017-10-03 HISTORY — PX: OTHER SURGICAL HISTORY: SHX169

## 2017-11-06 ENCOUNTER — Other Ambulatory Visit: Payer: Self-pay | Admitting: Obstetrics and Gynecology

## 2017-11-06 DIAGNOSIS — Z1231 Encounter for screening mammogram for malignant neoplasm of breast: Secondary | ICD-10-CM

## 2017-11-27 ENCOUNTER — Ambulatory Visit
Admission: RE | Admit: 2017-11-27 | Discharge: 2017-11-27 | Disposition: A | Discharge status: Home / Self Care | Payer: 59 | Source: Ambulatory Visit | Attending: Obstetrics and Gynecology | Admitting: Obstetrics and Gynecology

## 2017-11-27 DIAGNOSIS — Z1231 Encounter for screening mammogram for malignant neoplasm of breast: Secondary | ICD-10-CM

## 2017-11-28 LAB — PSA: PSA: NEGATIVE

## 2018-01-03 ENCOUNTER — Telehealth: Payer: Self-pay | Admitting: *Deleted

## 2018-01-03 MED ORDER — VENLAFAXINE HCL ER 150 MG PO CP24: 150.0000 mg | ORAL_CAPSULE | Freq: Every day | ORAL | 0 refills | Status: DC

## 2018-01-03 NOTE — Telephone Encounter (Signed)
Received fax from [facility] requesting 90 day supply of venlafaxine ER 150mg . Refill sent and mychart message sent to pt to schedule f/u around 02/18/18.

## 2018-04-06 ENCOUNTER — Ambulatory Visit: Payer: Self-pay | Admitting: Family

## 2018-04-06 ENCOUNTER — Telehealth: Payer: Self-pay | Admitting: Family

## 2018-04-06 NOTE — Telephone Encounter (Signed)
Do you know which medication she is needing refilled?

## 2018-04-06 NOTE — Telephone Encounter (Signed)
[address] [place] early today. appt on 7/11. Patient only has 5 pill left. Please send refill.

## 2018-04-08 ENCOUNTER — Other Ambulatory Visit: Payer: Self-pay | Admitting: Family

## 2018-04-09 NOTE — Telephone Encounter (Signed)
Medication has been sent in for patient. 

## 2018-04-12 ENCOUNTER — Ambulatory Visit (INDEPENDENT_AMBULATORY_CARE_PROVIDER_SITE_OTHER): Payer: 59 | Admitting: Family

## 2018-04-12 ENCOUNTER — Encounter: Payer: Self-pay | Admitting: Family

## 2018-04-12 VITALS — BP 125/73 | HR 79 | Temp 98.9°F | Resp 18 | Ht 61.0 in | Wt 131.2 lb

## 2018-04-12 DIAGNOSIS — F4323 Adjustment disorder with mixed anxiety and depressed mood: Secondary | ICD-10-CM

## 2018-04-12 DIAGNOSIS — F418 Other specified anxiety disorders: Secondary | ICD-10-CM

## 2018-04-12 DIAGNOSIS — H9202 Otalgia, left ear: Secondary | ICD-10-CM

## 2018-04-12 DIAGNOSIS — Z79899 Other long term (current) drug therapy: Secondary | ICD-10-CM

## 2018-04-12 MED ORDER — VENLAFAXINE HCL ER 75 MG PO CP24: 75.0000 mg | ORAL_CAPSULE | Freq: Every day | ORAL | 0 refills | Status: DC

## 2018-04-12 MED ORDER — ALPRAZOLAM 0.5 MG PO TABS: 0.5000 mg | ORAL_TABLET | Freq: Three times a day (TID) | ORAL | 0 refills | Status: DC | PRN

## 2018-04-12 NOTE — Progress Notes (Signed)
Subjective:    Patient ID: [name], female    DOB: [birth year], 47 y.o.   MRN: [id number]  HPI   Ms. Ponce Sue is a 47 yr old female who presents today for follow up.  She is maintained on effexor 150mg  once daily. Feels well on this dose. Reports that things have calmed down in her personal life and she is interest in trying to come off of effexor.    Left ear pain- has been present for several days.  Using ibuprofen with improvement in her pain.   Review of Systems See HPI  Past Medical History:  Diagnosis Date  . Depression   . DUB (dysfunctional uterine bleeding)   . Medical history non-contributory   . PONV (postoperative nausea and vomiting)      Social History   Socioeconomic History  . Marital status: Married    Spouse name: Not on file  . Number of children: Not on file  . Years of education: Not on file  . Highest education level: Not on file  Occupational History  . Not on file  Social Needs  . Financial resource strain: Not on file  . Food insecurity:    Worry: Not on file    Inability: Not on file  . Transportation needs:    Medical: Not on file    Non-medical: Not on file  Tobacco Use  . Smoking status: Never Smoker  . Smokeless tobacco: Never Used  Substance and Sexual Activity  . Alcohol use: Yes    Alcohol/week: 1.8 - 2.4 oz    Types: 3 - 4 Glasses of wine per week    Comment: occasional  . Drug use: No  . Sexual activity: Not on file  Lifestyle  . Physical activity:    Days per week: Not on file    Minutes per session: Not on file  . Stress: Not on file  Relationships  . Social connections:    Talks on phone: Not on file    Gets together: Not on file    Attends religious service: Not on file    Active member of club or organization: Not on file    Attends meetings of clubs or organizations: Not on file    Relationship status: Not on file  . Intimate partner violence:    Fear of current or ex partner: Not on file    Emotionally  abused: Not on file    Physically abused: Not on file    Forced sexual activity: Not on file  Other Topics Concern  . 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 CESAREAN SECTION     1997 & 2000  . DILATION AND CURETTAGE OF UTERUS    . DILITATION & CURRETTAGE/HYSTROSCOPY WITH NOVASURE ABLATION N/A 08/26/2013   Procedure: DILATATION & CURETTAGE/HYSTEROSCOPY WITH NOVASURE ABLATION;  Surgeon: Lenoard Adenichard J Taavon, MD;  Location: [facility];  Service: Gynecology;  Laterality: N/A;  . LAPAROSCOPIC TUBAL LIGATION Bilateral 08/26/2013   Procedure: LAPAROSCOPIC TUBAL LIGATION;  Surgeon: Lenoard Adenichard J Taavon, MD;  Location: [facility];  Service: Gynecology;  Laterality: Bilateral;  . TUBAL LIGATION  08/2013    Family History  Problem Relation Age of Onset  . Uterine cancer Mother   . Throat cancer Father        cigar smoker  . Cancer Father        throat and prostate  .  Cancer Maternal Aunt 75       breast  . Breast cancer Maternal Aunt     No Known Allergies  Current Outpatient Medications on File Prior to Visit  Medication Sig Dispense Refill  . ALPRAZolam (XANAX) 0.5 MG tablet Take 1 tablet (0.5 mg total) by mouth 3 (three) times daily as needed. 45 tablet 0  . venlafaxine XR (EFFEXOR-XR) 150 MG 24 hr capsule TAKE 1 CAPSULE (150 MG TOTAL) BY MOUTH DAILY WITH BREAKFAST. 90 capsule 0   No current facility-administered medications on file prior to visit.     BP 125/73 (BP Location: Right Arm, Cuff Size: Normal)   Pulse 79   Temp 98.9 F (37.2 C) (Oral)   Resp 18   Ht 5\' 1"  (1.549 m)   Wt 131 lb 3.2 oz (59.5 kg)   SpO2 99%   BMI 24.79 kg/m       Objective:   Physical Exam  Constitutional: She appears well-developed and well-nourished.  HENT:  Head: Normocephalic and atraumatic.  Right Ear: Tympanic membrane and ear canal normal.  Left Ear: Tympanic membrane and ear canal normal.  Mouth/Throat: No  oropharyngeal exudate, posterior oropharyngeal edema or posterior oropharyngeal erythema.  Neck: Normal range of motion. Neck supple.  Tender enlarged left anterior cervical lymph node  Cardiovascular: Normal rate, regular rhythm and normal heart sounds.  No murmur heard. Pulmonary/Chest: Effort normal and breath sounds normal. No respiratory distress. She has no wheezes.  Psychiatric: She has a normal mood and affect. Her behavior is normal. Judgment and thought content normal.          Assessment & Plan:  Otalgia- normal ear exam. ? Viral etiology with finding of tender left cervical lymph node.  Advised pt to let us know if LN and pain do not improve. Continue ibuprofen prn.  She verbalizes understanding.   Depression/anxiety- stable on current dose of effexor 150mg . Will decrease to 75mg  once daily.  Refill provided on xanax. Obtain UDS.

## 2018-04-12 NOTE — Patient Instructions (Signed)
Please decrease effexor to 75mg  once daily. Call if you have any withdrawal symptoms with this decreased dose. Complete lab work prior to leaving.

## 2018-04-18 ENCOUNTER — Telehealth: Payer: Self-pay | Admitting: Family

## 2018-04-18 NOTE — Telephone Encounter (Signed)
UDS was not completed at time of her visit, can we please ask her to return to complete this week?

## 2018-04-19 NOTE — Telephone Encounter (Signed)
Attempted to reach pt and left message for her to return my call. [facility] with [facility].

## 2018-05-05 ENCOUNTER — Other Ambulatory Visit: Payer: Self-pay | Admitting: Family

## 2018-05-11 ENCOUNTER — Telehealth: Payer: Self-pay | Admitting: Family

## 2018-05-11 ENCOUNTER — Ambulatory Visit: Payer: 59 | Admitting: Family

## 2018-05-11 DIAGNOSIS — Z0289 Encounter for other administrative examinations: Secondary | ICD-10-CM

## 2018-05-11 MED ORDER — VENLAFAXINE HCL ER 75 MG PO CP24: ORAL_CAPSULE | ORAL | 0 refills | Status: DC

## 2018-05-11 NOTE — Telephone Encounter (Signed)
Patient came 20 minutes late for 20 minute appointment. She was asked to reschedule but will run out of effexor.  Refill sent.

## 2018-05-16 ENCOUNTER — Ambulatory Visit: Payer: 59 | Admitting: Family

## 2018-06-25 ENCOUNTER — Encounter: Payer: Self-pay | Admitting: Family

## 2018-06-25 ENCOUNTER — Ambulatory Visit (INDEPENDENT_AMBULATORY_CARE_PROVIDER_SITE_OTHER): Payer: 59 | Admitting: Family

## 2018-06-25 VITALS — BP 102/66 | HR 76 | Temp 98.0°F | Resp 16 | Ht 61.0 in | Wt 130.8 lb

## 2018-06-25 DIAGNOSIS — F419 Anxiety disorder, unspecified: Secondary | ICD-10-CM | POA: Diagnosis not present

## 2018-06-25 DIAGNOSIS — F329 Major depressive disorder, single episode, unspecified: Secondary | ICD-10-CM

## 2018-06-25 DIAGNOSIS — Z23 Encounter for immunization: Secondary | ICD-10-CM | POA: Diagnosis not present

## 2018-06-25 DIAGNOSIS — F32A Depression, unspecified: Secondary | ICD-10-CM

## 2018-06-25 MED ORDER — VENLAFAXINE HCL ER 37.5 MG PO CP24: 37.5000 mg | ORAL_CAPSULE | Freq: Every day | ORAL | 0 refills | Status: DC

## 2018-06-25 NOTE — Progress Notes (Signed)
Subjective:    Patient ID: [name], female    DOB: August 04, 1971, 47 y.o.   MRN: [id number]  HPI  Ms. [name] is a 47 yr old female who presents today for follow up of her anxiety/depression. Last visit she noted that things had calmed down in her life and she desired to begin to wean off of effexor. We decreased effexor from 150mg  to 75mg  xr once daily. She reports that she felt a bit irritable for the first few weeks on the lower dose but since that time she has been doing quite well. Continues rare use of prn xanax.   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 Then suffered a dog bite to her nose and left forearm. Areas have healed well but she still has some scarring on her nose.    Review of Systems    see HPI  Past Medical History:  Diagnosis Date  . Depression   . DUB (dysfunctional uterine bleeding)   . Medical history non-contributory   . PONV (postoperative nausea and vomiting)      Social History   Socioeconomic History  . Marital status: Married    Spouse name: Not on file  . Number of children: Not on file  . Years of education: Not on file  . Highest education level: Not on file  Occupational History  . Not on file  Social Needs  . Financial resource strain: Not on file  . Food insecurity:    Worry: Not on file    Inability: Not on file  . Transportation needs:    Medical: Not on file    Non-medical: Not on file  Tobacco Use  . Smoking status: Never Smoker  . Smokeless tobacco: Never Used  Substance and Sexual Activity  . Alcohol use: Yes    Alcohol/week: 3.0 - 4.0 standard drinks    Types: 3 - 4 Glasses of wine per week    Comment: occasional  . Drug use: No  . Sexual activity: Not on file  Lifestyle  . Physical activity:    Days per week: Not on file    Minutes per session: Not on file  . Stress: Not on file  Relationships  . Social connections:    Talks on phone: Not on file    Gets together: Not on file   Attends religious service: Not on file    Active member of club or organization: Not on file    Attends meetings of clubs or organizations: Not on file    Relationship status: Not on file  . Intimate partner violence:    Fear of current or ex partner: Not on file    Emotionally abused: Not on file    Physically abused: Not on file    Forced sexual activity: Not on file  Other Topics Concern  . 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 CESAREAN SECTION     1997 & 2000  . DILATION AND CURETTAGE OF UTERUS    . DILITATION & CURRETTAGE/HYSTROSCOPY WITH NOVASURE ABLATION N/A 08/26/2013   Procedure: DILATATION & CURETTAGE/HYSTEROSCOPY WITH NOVASURE ABLATION;  Surgeon: Lenoard Aden, MD;  Location: [facility];  Service: Gynecology;  Laterality: N/A;  . LAPAROSCOPIC TUBAL LIGATION Bilateral 08/26/2013   Procedure: LAPAROSCOPIC TUBAL LIGATION;  Surgeon: Lenoard Aden, MD;  Location: [facility];  Service: Gynecology;  Laterality: Bilateral;  .  TUBAL LIGATION  08/2013    Family History  Problem Relation Age of Onset  . Uterine cancer Mother   . Throat cancer Father        cigar smoker  . Cancer Father        throat and prostate  . Cancer Maternal Aunt 75       breast  . Breast cancer Maternal Aunt     No Known Allergies  Current Outpatient Medications on File Prior to Visit  Medication Sig Dispense Refill  . ALPRAZolam (XANAX) 0.5 MG tablet Take 1 tablet (0.5 mg total) by mouth 3 (three) times daily as needed. 30 tablet 0  . venlafaxine XR (EFFEXOR-XR) 75 MG 24 hr capsule TAKE ONE CAPSULE BY MOUTH EVERY DAY WITH BREAKFAST 30 capsule 0   No current facility-administered medications on file prior to visit.     BP 102/66 (BP Location: Left Arm, Cuff Size: Normal)   Pulse 76   Temp 98 F (36.7 C) (Oral)   Resp 16   Ht 5\' 1"  (1.549 m)   Wt 130 lb 12.8 oz (59.3 kg)   LMP 01/31/2018   SpO2 98%   BMI  24.71 kg/m    Objective:   Physical Exam  Constitutional: She is oriented to person, place, and time. She appears well-developed and well-nourished.  Neurological: She is alert and oriented to person, place, and time.  Skin:  Scar noted left external nare Scar noted left inner forearm  Psychiatric: She has a normal mood and affect. Her behavior is normal. Judgment and thought content normal.          Assessment & Plan:  Anxiety/depression- stable. Advised pt as follows:    Please decrease effexor to 37.5 mg once daily for 4 weeks then stop.   A total of 15  minutes were spent face-to-face with the patient during this encounter and over half of that time was spent on counseling and coordination of care. The patient was counseled on depression/anxiety treatment and taper.   Follow up in 6 weeks.

## 2018-06-25 NOTE — Patient Instructions (Signed)
Please decrease effexor to 37.5 mg once daily for 4 weeks then stop.

## 2018-07-21 ENCOUNTER — Other Ambulatory Visit: Payer: Self-pay | Admitting: Family

## 2018-07-22 ENCOUNTER — Other Ambulatory Visit: Payer: Self-pay | Admitting: Family

## 2018-08-07 ENCOUNTER — Ambulatory Visit (INDEPENDENT_AMBULATORY_CARE_PROVIDER_SITE_OTHER): Payer: 59 | Admitting: Family

## 2018-08-07 ENCOUNTER — Encounter: Payer: Self-pay | Admitting: Family

## 2018-08-07 VITALS — BP 112/69 | HR 75 | Temp 98.2°F | Resp 16 | Ht 61.0 in | Wt 133.0 lb

## 2018-08-07 DIAGNOSIS — L989 Disorder of the skin and subcutaneous tissue, unspecified: Secondary | ICD-10-CM | POA: Diagnosis not present

## 2018-08-07 DIAGNOSIS — Z0000 Encounter for general adult medical examination without abnormal findings: Secondary | ICD-10-CM

## 2018-08-07 DIAGNOSIS — R202 Paresthesia of skin: Secondary | ICD-10-CM | POA: Diagnosis not present

## 2018-08-07 DIAGNOSIS — F419 Anxiety disorder, unspecified: Secondary | ICD-10-CM

## 2018-08-07 LAB — B12 AND FOLATE PANEL
Folate: 9.5 ng/mL (ref >5.9)
VITAMIN B 12: 407 pg/mL (ref 211–911)

## 2018-08-07 LAB — CBC WITH DIFFERENTIAL/PLATELET
Basophils Absolute: 0 10*3/uL (ref 0.0–0.1)
Basophils Relative: 1.2 % (ref 0.0–3.0)
EOS ABS: 0.2 10*3/uL (ref 0.0–0.7)
Eosinophils Relative: 5.1 % — ABNORMAL HIGH (ref 0.0–5.0)
HCT: 40 % (ref 36.0–46.0)
HEMOGLOBIN: 13.8 g/dL (ref 12.0–15.0)
Lymphocytes Relative: 36.4 % (ref 12.0–46.0)
Lymphs Abs: 1.3 10*3/uL (ref 0.7–4.0)
MCHC: 34.5 g/dL (ref 30.0–36.0)
MCV: 89.9 fl (ref 78.0–100.0)
Monocytes Absolute: 0.3 10*3/uL (ref 0.1–1.0)
Monocytes Relative: 8.9 % (ref 3.0–12.0)
NEUTROS PCT: 48.4 % (ref 43.0–77.0)
Neutro Abs: 1.8 10*3/uL (ref 1.4–7.7)
Platelets: 203 10*3/uL (ref 150.0–400.0)
RBC: 4.45 Mil/uL (ref 3.87–5.11)
RDW: 13 % (ref 11.5–15.5)
WBC: 3.7 10*3/uL — AB (ref 4.0–10.5)

## 2018-08-07 LAB — LIPID PANEL
CHOL/HDL RATIO: 3
Cholesterol: 211 mg/dL — ABNORMAL HIGH (ref 0–200)
HDL: 63.8 mg/dL (ref >39.00)
LDL CALC: 136 mg/dL — AB (ref 0–99)
NonHDL: 147.32
TRIGLYCERIDES: 56 mg/dL (ref 0.0–149.0)
VLDL: 11.2 mg/dL (ref 0.0–40.0)

## 2018-08-07 LAB — URINALYSIS, ROUTINE W REFLEX MICROSCOPIC
Bilirubin Urine: NEGATIVE
Ketones, ur: NEGATIVE
Leukocytes, UA: NEGATIVE
Nitrite: NEGATIVE
SPECIFIC GRAVITY, URINE: 1.015 (ref 1.000–1.030)
Total Protein, Urine: NEGATIVE
URINE GLUCOSE: NEGATIVE
Urobilinogen, UA: 0.2 (ref 0.0–1.0)
pH: 7 (ref 5.0–8.0)

## 2018-08-07 LAB — HEPATIC FUNCTION PANEL
ALT: 13 U/L (ref 0–35)
AST: 15 U/L (ref 0–37)
Albumin: 4.2 g/dL (ref 3.5–5.2)
Alkaline Phosphatase: 53 U/L (ref 39–117)
BILIRUBIN DIRECT: 0.1 mg/dL (ref 0.0–0.3)
BILIRUBIN TOTAL: 0.5 mg/dL (ref 0.2–1.2)
Total Protein: 6.3 g/dL (ref 6.0–8.3)

## 2018-08-07 LAB — TSH: TSH: 1.93 u[IU]/mL (ref 0.35–4.50)

## 2018-08-07 LAB — BASIC METABOLIC PANEL
BUN: 16 mg/dL (ref 6–23)
CALCIUM: 9 mg/dL (ref 8.4–10.5)
CO2: 30 mEq/L (ref 19–32)
Chloride: 104 mEq/L (ref 96–112)
Creatinine, Ser: 0.6 mg/dL (ref 0.40–1.20)
GFR: 112.86 mL/min (ref >60.00)
Glucose, Bld: 89 mg/dL (ref 70–99)
POTASSIUM: 4.1 meq/L (ref 3.5–5.1)
SODIUM: 139 meq/L (ref 135–145)

## 2018-08-07 MED ORDER — MELOXICAM 7.5 MG PO TABS: 7.5000 mg | ORAL_TABLET | Freq: Every day | ORAL | 0 refills | Status: DC

## 2018-08-07 NOTE — Patient Instructions (Signed)
Please complete lab work prior to leaving. Begin meloxicam once daily. Let me know if numbness worsens or fails to improve. You should be contacted about your referral to the dermatologist.

## 2018-08-07 NOTE — Progress Notes (Deleted)
ek 

## 2018-08-07 NOTE — Progress Notes (Signed)
Subjective:    Patient ID: [name], female    DOB: 1970-12-08, 47 y.o.   MRN: [id number]  HPI  Patient presents today for complete physical.  Immunizations: tetanus and flu up to date Diet: trying to eat healthy Exercise:  [employer] 4 days a week, running/weights Colonoscopy: will begin next year Pap Smear:1/19 per pt- taavon Mammogram- 11/27/17 Vision: last week Dental:  due  Wt Readings from Last 3 Encounters:  08/07/18 133 lb (60.3 kg)  06/25/18 130 lb 12.8 oz (59.3 kg)  04/12/18 131 lb 3.2 oz (59.5 kg)  Notes "tingling" in both her legs, worsened when she stopped her effexor. L>R, denies back pain.  Numbness down inner shin into the medial foot bilaterally L>R   Review of Systems  Constitutional: Negative for unexpected weight change.  HENT: Positive for rhinorrhea.   Eyes: Positive for visual disturbance.  Respiratory: Negative for cough.   Cardiovascular: Negative for leg swelling.  Gastrointestinal: Negative for constipation and diarrhea.  Genitourinary: Negative for dysuria, frequency and hematuria.  Musculoskeletal: Negative for back pain.  Skin: Negative for rash.  Neurological: Positive for numbness. Negative for headaches.  Hematological: Negative for adenopathy.  Psychiatric/Behavioral:       Denies depression/anxiety symptoms   Past Medical History:  Diagnosis Date  . Depression   . DUB (dysfunctional uterine bleeding)   . Keratoconus   . Medical history non-contributory   . PONV (postoperative nausea and vomiting)      Social History   Socioeconomic History  . Marital status: Married    Spouse name: Not on file  . Number of children: Not on file  . Years of education: Not on file  . Highest education level: Not on file  Occupational History  . Not on file  Social Needs  . Financial resource strain: Not on file  . Food insecurity:    Worry: Not on file    Inability: Not on file  . Transportation needs:    Medical: Not on file     Non-medical: Not on file  Tobacco Use  . Smoking status: Never Smoker  . Smokeless tobacco: Never Used  Substance and Sexual Activity  . Alcohol use: Yes    Alcohol/week: 3.0 - 4.0 standard drinks    Types: 3 - 4 Glasses of wine per week    Comment: occasional  . Drug use: No  . Sexual activity: Not on file  Lifestyle  . Physical activity:    Days per week: Not on file    Minutes per session: Not on file  . Stress: Not on file  Relationships  . Social connections:    Talks on phone: Not on file    Gets together: Not on file    Attends religious service: Not on file    Active member of club or organization: Not on file    Attends meetings of clubs or organizations: Not on file    Relationship status: Not on file  . Intimate partner violence:    Fear of current or ex partner: Not on file    Emotionally abused: Not on file    Physically abused: Not on file    Forced sexual activity: Not on file  Other Topics Concern  . 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 CESAREAN SECTION     1997 & 2000  . corneal surgery  2019  .  DILATION AND CURETTAGE OF UTERUS    . DILITATION & CURRETTAGE/HYSTROSCOPY WITH NOVASURE ABLATION N/A 08/26/2013   Procedure: DILATATION & CURETTAGE/HYSTEROSCOPY WITH NOVASURE ABLATION;  Surgeon: Lenoard Aden, MD;  Location: [facility];  Service: Gynecology;  Laterality: N/A;  . LAPAROSCOPIC TUBAL LIGATION Bilateral 08/26/2013   Procedure: LAPAROSCOPIC TUBAL LIGATION;  Surgeon: Lenoard Aden, MD;  Location: [facility];  Service: Gynecology;  Laterality: Bilateral;  . TUBAL LIGATION  08/2013    Family History  Problem Relation Age of Onset  . Uterine cancer Mother   . Throat cancer Father        cigar smoker  . Cancer Father        throat and prostate  . Cancer Maternal Aunt 75       breast  . Breast cancer Maternal Aunt     No Known Allergies  Current  Outpatient Medications on File Prior to Visit  Medication Sig Dispense Refill  . ALPRAZolam (XANAX) 0.5 MG tablet Take 1 tablet (0.5 mg total) by mouth 3 (three) times daily as needed. 30 tablet 0  . venlafaxine XR (EFFEXOR-XR) 37.5 MG 24 hr capsule TAKE 1 CAPSULE (37.5 MG TOTAL) BY MOUTH DAILY WITH BREAKFAST. (Patient not taking: Reported on 08/07/2018) 90 capsule 1   No current facility-administered medications on file prior to visit.     BP 112/69 (BP Location: Right Arm, Patient Position: Sitting, Cuff Size: Small)   Pulse 75   Temp 98.2 F (36.8 C) (Oral)   Resp 16   Ht 5\' 1"  (1.549 m)   Wt 133 lb (60.3 kg)   SpO2 100%   BMI 25.13 kg/m       Objective:   Physical Exam  Physical Exam  Constitutional: She is oriented to person, place, and time. She appears well-developed and well-nourished. No distress.  HENT:  Head: Normocephalic and atraumatic.  Right Ear: Tympanic membrane and ear canal normal.  Left Ear: Tympanic membrane and ear canal normal.  Mouth/Throat: Oropharynx is clear and moist.  Eyes:  No scleral icterus.  Neck: Normal range of motion. No thyromegaly present.  Cardiovascular: Normal rate and regular rhythm.   No murmur heard. Pulmonary/Chest: Effort normal and breath sounds normal. No respiratory distress. He has no wheezes. She has no rales. She exhibits no tenderness.  Abdominal: Soft. Bowel sounds are normal. She exhibits no distension and no mass. There is no tenderness. There is no rebound and no guarding.  Musculoskeletal: She exhibits no edema.  Lymphadenopathy:    She has no cervical adenopathy.  Neurological: She is alert and oriented to person, place, and time. She has normal patellar reflexes. She exhibits normal muscle tone. Coordination normal.  Skin: Skin is warm and dry. two raised lesions noted on forehead Psychiatric: She has a normal mood and affect. Her behavior is normal. Judgment and thought content normal.  Breasts: Examined  lying Right: Without masses, retractions, discharge or axillary adenopathy.  Left: Without masses, retractions, discharge or axillary adenopathy.         Assessment & Plan:         Assessment & Plan:  Preventative care- discussed healthy diet, exercise.  Obtain routine lab work. Pap, mammo, immunizations reviewed and  Up to date.  EKG tracing is personally reviewed.  EKG notes NSR.  No acute changes.   Paresthesias- suspect L4/L5 radiculopathy as cause. Trial of meloxicam. Pt is advised to call if new/worsening symptoms or if symptoms fail to improve. Will also check b12 and folate  level.  Skin lesions- new. Refer to derm for further evaluation.   Anxiety- stable off of effexor. Using xanax 1-2 times per week, check follow up uds.

## 2018-08-10 LAB — PAIN MGMT, PROFILE 8 W/CONF, U
6 ACETYLMORPHINE: NEGATIVE ng/mL (ref <10)
ALCOHOL METABOLITES: NEGATIVE ng/mL (ref <500)
AMINOCLONAZEPAM: NEGATIVE ng/mL (ref <25)
AMPHETAMINES: NEGATIVE ng/mL (ref <500)
Alphahydroxyalprazolam: 92 ng/mL — ABNORMAL HIGH (ref <25)
Alphahydroxymidazolam: NEGATIVE ng/mL (ref <50)
Alphahydroxytriazolam: NEGATIVE ng/mL (ref <50)
Benzodiazepines: POSITIVE ng/mL — AB (ref <100)
Buprenorphine, Urine: NEGATIVE ng/mL (ref <5)
COCAINE METABOLITE: NEGATIVE ng/mL (ref <150)
CREATININE: 102.9 mg/dL
Hydroxyethylflurazepam: NEGATIVE ng/mL (ref <50)
LORAZEPAM: NEGATIVE ng/mL (ref <50)
MDMA: NEGATIVE ng/mL (ref <500)
Marijuana Metabolite: NEGATIVE ng/mL (ref <20)
Nordiazepam: NEGATIVE ng/mL (ref <50)
OPIATES: NEGATIVE ng/mL (ref <100)
OXIDANT: NEGATIVE ug/mL (ref <200)
Oxazepam: NEGATIVE ng/mL (ref <50)
Oxycodone: NEGATIVE ng/mL (ref <100)
PH: 6.85 (ref 4.5–9.0)
Temazepam: NEGATIVE ng/mL (ref <50)

## 2018-08-15 ENCOUNTER — Telehealth: Payer: Self-pay | Admitting: *Deleted

## 2018-08-15 NOTE — Telephone Encounter (Signed)
-----   Message from Sandford CrazeMelissa O'Sullivan, NP sent at 08/07/2018  9:25 AM EST ----- [facility]?

## 2018-08-15 NOTE — Telephone Encounter (Signed)
Request faxed. Awaiting result.

## 2018-08-17 ENCOUNTER — Encounter: Payer: Self-pay | Admitting: Family

## 2018-08-17 NOTE — Telephone Encounter (Signed)
Result received, abstracted and forwarded to PCP for review. 

## 2018-12-03 ENCOUNTER — Other Ambulatory Visit: Payer: Self-pay | Admitting: Obstetrics and Gynecology

## 2018-12-03 DIAGNOSIS — Z1231 Encounter for screening mammogram for malignant neoplasm of breast: Secondary | ICD-10-CM

## 2018-12-26 ENCOUNTER — Ambulatory Visit: Payer: Self-pay

## 2019-02-05 ENCOUNTER — Other Ambulatory Visit: Payer: Self-pay

## 2019-02-05 ENCOUNTER — Ambulatory Visit: Payer: Self-pay | Admitting: Family

## 2019-02-05 ENCOUNTER — Ambulatory Visit (INDEPENDENT_AMBULATORY_CARE_PROVIDER_SITE_OTHER): Payer: 59 | Admitting: Family

## 2019-02-05 DIAGNOSIS — F419 Anxiety disorder, unspecified: Secondary | ICD-10-CM

## 2019-02-05 MED ORDER — ALPRAZOLAM 0.5 MG PO TABS: 0.5000 mg | ORAL_TABLET | Freq: Two times a day (BID) | ORAL | 0 refills | Status: DC | PRN

## 2019-02-05 MED ORDER — BUSPIRONE HCL 7.5 MG PO TABS: 7.5000 mg | ORAL_TABLET | Freq: Two times a day (BID) | ORAL | 1 refills | Status: DC

## 2019-02-05 NOTE — Progress Notes (Signed)
Virtual Visit via Video Note  I connected with [name] on 02/05/19 at  3:00 PM EDT by a video enabled telemedicine application and verified that I am speaking with the correct person using two identifiers. This visit type was conducted due to national recommendations for restrictions regarding the COVID-19 Pandemic (e.g. social distancing).  This format is felt to be most appropriate for this patient at this time.   I discussed the limitations of evaluation and management by telemedicine and the availability of in person appointments. The patient expressed understanding and agreed to proceed.  Only the patient and myself were on today's video visit. The patient was at home and I was in my office at the time of today's visit.   History of Present Illness:   Anxiety- feels like being home is an adjustment. Felt like for the first 3 months coming off of medication (effexor) "was a nightmare." Recently she notes increased anxiety. Not sure if her increased anxiety is due to working from home or what is going on in the world.  Reports that she is using xanax prn.    GAD 7 : Generalized Anxiety Score 02/05/2019  Nervous, Anxious, on Edge 1  Control/stop worrying 3  Worry too much - different things 3  Trouble relaxing 1  Restless 0  Easily annoyed or irritable 1  Afraid - awful might happen 0  Total GAD 7 Score 9  Anxiety Difficulty Somewhat difficult     Observations/Objective:   Gen: Awake, alert, no acute distress Resp: Breathing is even and non-labored Psych: calm/pleasant demeanor Neuro: Alert and Oriented x 3, + facial symmetry, speech is clear.    Assessment and Plan:  Anxiety- uncontrolled.  She wishes to try a different med than effexor since it was so difficult for her to taper off of. She had weight gain on citalopram. She hopes not to stay on medication long term. Will initiate trial of buspar.  Continue prn xanax.    Follow Up Instructions:  1 month.    I  discussed the assessment and treatment plan with the patient. The patient was provided an opportunity to ask questions and all were answered. The patient agreed with the plan and demonstrated an understanding of the instructions.   The patient was advised to call back or seek an in-person evaluation if the symptoms worsen or if the condition fails to improve as anticipated.    Lemont Fillers, NP

## 2019-02-06 ENCOUNTER — Telehealth: Payer: Self-pay | Admitting: Family

## 2019-02-06 NOTE — Telephone Encounter (Signed)
LVM for pt to schedule (Return in about 4 weeks (around 03/05/2019) for follow up visit.) Pt had a vov and is needing to fu  In 4 wks.

## 2019-02-27 ENCOUNTER — Other Ambulatory Visit: Payer: Self-pay | Admitting: Family

## 2019-06-18 ENCOUNTER — Ambulatory Visit
Admission: RE | Admit: 2019-06-18 | Discharge: 2019-06-18 | Disposition: A | Discharge status: Home / Self Care | Payer: 59 | Source: Ambulatory Visit | Attending: Obstetrics and Gynecology | Admitting: Obstetrics and Gynecology

## 2019-06-18 ENCOUNTER — Other Ambulatory Visit: Payer: Self-pay

## 2019-06-18 DIAGNOSIS — Z1231 Encounter for screening mammogram for malignant neoplasm of breast: Secondary | ICD-10-CM

## 2020-05-22 ENCOUNTER — Other Ambulatory Visit: Payer: Self-pay | Admitting: Obstetrics and Gynecology

## 2020-05-22 DIAGNOSIS — Z1231 Encounter for screening mammogram for malignant neoplasm of breast: Secondary | ICD-10-CM

## 2020-06-03 ENCOUNTER — Telehealth: Payer: Self-pay | Admitting: Family

## 2020-06-03 ENCOUNTER — Ambulatory Visit (INDEPENDENT_AMBULATORY_CARE_PROVIDER_SITE_OTHER): Payer: No Typology Code available for payment source | Admitting: Family

## 2020-06-03 ENCOUNTER — Encounter: Payer: Self-pay | Admitting: Family

## 2020-06-03 ENCOUNTER — Other Ambulatory Visit: Payer: Self-pay

## 2020-06-03 VITALS — BP 120/73 | HR 74 | Temp 98.2°F | Resp 16 | Ht 61.0 in | Wt 129.0 lb

## 2020-06-03 DIAGNOSIS — Z0000 Encounter for general adult medical examination without abnormal findings: Secondary | ICD-10-CM

## 2020-06-03 DIAGNOSIS — F419 Anxiety disorder, unspecified: Secondary | ICD-10-CM | POA: Diagnosis not present

## 2020-06-03 NOTE — Patient Instructions (Signed)
Please complete lab work prior to leaving.  Continue healthy diet and regular exercise.  

## 2020-06-03 NOTE — Telephone Encounter (Signed)
Please call [facility]) and request Pap.

## 2020-06-03 NOTE — Progress Notes (Signed)
Subjective:    Patient ID: [name], female    DOB: 1971/07/18, 49 y.o.   MRN: [id number]  HPI  Patient is a 50 yr old female who presents today for cpx.   Immunizations:  Has second moderna scheduled tomorrow.  Diet:  healthy Exercise: several days a week. Either runs or Pure Barr  Wt Readings from Last 3 Encounters:  06/03/20 129 lb (58.5 kg)  08/07/18 133 lb (60.3 kg)  06/25/18 130 lb 12.8 oz (59.3 kg)  Colonoscopy:  due Pap Smear:  2/19, had one last year.   Mammogram:  06/18/19  Vision: up to date Dental: up to date  Anxiety- last visit we gave her a trial of buspar and continued prn xanax. She reports that she took buspar only briefly then stopped.   Her GYN then started her on effexor for hot flashes. She notes that her anxiety and hot flashes are both stable.   Review of Systems  Constitutional: Negative for unexpected weight change.  HENT: Negative for hearing loss and rhinorrhea.   Eyes: Negative for visual disturbance.  Respiratory: Negative for cough and shortness of breath.   Cardiovascular: Negative for chest pain.  Gastrointestinal: Negative for constipation and diarrhea.  Genitourinary: Negative for dysuria, frequency, hematuria and urgency.  Musculoskeletal: Negative for arthralgias and myalgias.  Skin: Negative for rash.  Neurological: Negative for headaches.  Hematological: Negative for adenopathy.  Psychiatric/Behavioral:       Denies depression   Past Medical History:  Diagnosis Date  . Depression   . DUB (dysfunctional uterine bleeding)   . Keratoconus   . Medical history non-contributory   . PONV (postoperative nausea and vomiting)      Social History   Socioeconomic History  . Marital status: Single    Spouse name: Not on file  . Number of children: Not on file  . Years of education: Not on file  . Highest education level: Not on file  Occupational History  . Not on file  Tobacco Use  . Smoking status: Never Smoker  .  Smokeless tobacco: Never Used  Substance and Sexual Activity  . Alcohol use: Yes    Alcohol/week: 3.0 - 4.0 standard drinks    Types: 3 - 4 Glasses of wine per week    Comment: occasional  . Drug use: No  . Sexual activity: Not Currently  Other Topics Concern  . 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 Difficulty of Paying Living Expenses: Not on file  Food Insecurity:   . Worried About Programme researcher, broadcasting/film/video in the Last Year: Not on file  . Ran Out of Food in the Last Year: Not on file  Transportation Needs:   . Lack of Transportation (Medical): Not on file  . Lack of Transportation (Non-Medical): Not on file  Physical Activity:   . Days of Exercise per Week: Not on file  . Minutes of Exercise per Session: Not on file  Stress:   . Feeling of Stress : Not on file  Social Connections:   . Frequency of Communication with Friends and Family: Not on file  . Frequency of Social Gatherings with Friends and Family: Not on file  . Attends Religious Services: Not on file  . Active Member of Clubs or Organizations: Not on file  . Attends Banker Meetings: Not on file  . Marital Status: Not  on file  Intimate Partner Violence:   . Fear of Current or Ex-Partner: Not on file  . Emotionally Abused: Not on file  . Physically Abused: Not on file  . Sexually Abused: Not on file    Past Surgical History:  Procedure Laterality Date  . CESAREAN SECTION     1997 & 2000  . corneal surgery  2019  . DILATION AND CURETTAGE OF UTERUS    . DILITATION & CURRETTAGE/HYSTROSCOPY WITH NOVASURE ABLATION N/A 08/26/2013   Procedure: DILATATION & CURETTAGE/HYSTEROSCOPY WITH NOVASURE ABLATION;  Surgeon: Lenoard Aden, MD;  Location: [facility];  Service: Gynecology;  Laterality: N/A;  . LAPAROSCOPIC TUBAL LIGATION Bilateral 08/26/2013   Procedure: LAPAROSCOPIC TUBAL LIGATION;  Surgeon: Lenoard Aden,  MD;  Location: [facility];  Service: Gynecology;  Laterality: Bilateral;  . TUBAL LIGATION  08/2013    Family History  Problem Relation Age of Onset  . Uterine cancer Mother   . Throat cancer Father        cigar smoker  . Cancer Father        throat and prostate  . Cancer Maternal Aunt 75       breast  . Breast cancer Maternal Aunt   . CVA Maternal Aunt   . Hypertension Sister   . CVA Maternal Grandmother        brain hemorrhage  . Lung cancer Maternal Grandfather        smoker  . Stomach cancer Paternal Grandmother   . Liver cancer Paternal Grandfather     No Known Allergies  Current Outpatient Medications on File Prior to Visit  Medication Sig Dispense Refill  . ALPRAZolam (XANAX) 0.5 MG tablet Take 1 tablet (0.5 mg total) by mouth 2 (two) times daily as needed. 30 tablet 0  . venlafaxine (EFFEXOR) 37.5 MG tablet      No current facility-administered medications on file prior to visit.    BP 120/73 (BP Location: Right Arm, Patient Position: Sitting, Cuff Size: Small)   Pulse 74   Temp 98.2 F (36.8 C) (Oral)   Resp 16   Ht 5\' 1"  (1.549 m)   Wt 129 lb (58.5 kg)   SpO2 100%   BMI 24.37 kg/m       Objective:   Physical Exam  Physical Exam  Constitutional: She is oriented to person, place, and time. She appears well-developed and well-nourished. No distress.  HENT:  Head: Normocephalic and atraumatic.  Right Ear: Tympanic membrane and ear canal normal.  Left Ear: Tympanic membrane and ear canal normal.  Mouth/Throat: Not examined- pt wearing mask Eyes: Pupils are equal, round, and reactive to light. No scleral icterus.  Neck: Normal range of motion. No thyromegaly present.  Cardiovascular: Normal rate and regular rhythm.   No murmur heard. Pulmonary/Chest: Effort normal and breath sounds normal. No respiratory distress. He has no wheezes. She has no rales. She exhibits no tenderness.  Abdominal: Soft. Bowel sounds are normal. She exhibits no distension and no  mass. There is no tenderness. There is no rebound and no guarding.  Musculoskeletal: She exhibits no edema.  Lymphadenopathy:    She has no cervical adenopathy.  Neurological: She is alert and oriented to person, place, and time. She has normal patellar reflexes. She exhibits normal muscle tone. Coordination normal.  Skin: Skin is warm and dry.  Psychiatric: She has a normal mood and affect. Her behavior is normal. Judgment and thought content normal.  Breast/pelvis: not examined.  Assessment & Plan:  Preventative care- encouraged pt to continue healthy diet and regular exercise. She will complete mammo later this month.  Pap up to date. Refer for colo.  Recommended shingrix and flu shot at least 2 weeks after she receives her second covid vaccine.  Anxiety- stable on low dose effexor- being prescribed by GYN. She continues xanax PRN. Using about once a week. Controlled substance contract is updated. Will obtain routine UDS.  This visit occurred during the SARS-CoV-2 public health emergency.  Safety protocols were in place, including screening questions prior to the visit, additional usage of staff PPE, and extensive cleaning of exam room while observing appropriate contact time as indicated for disinfecting solutions.           Assessment & Plan:

## 2020-06-03 NOTE — Telephone Encounter (Signed)
Records release request faxed over to [facility] obgyn

## 2020-06-04 LAB — HEPATIC FUNCTION PANEL
AG Ratio: 1.9 (calc) (ref 1.0–2.5)
ALT: 12 U/L (ref 6–29)
AST: 18 U/L (ref 10–35)
Albumin: 4.6 g/dL (ref 3.6–5.1)
Alkaline phosphatase (APISO): 77 U/L (ref 37–153)
Bilirubin, Direct: 0.1 mg/dL (ref 0.0–0.2)
Globulin: 2.4 g/dL (calc) (ref 1.9–3.7)
Indirect Bilirubin: 0.5 mg/dL (calc) (ref 0.2–1.2)
Total Bilirubin: 0.6 mg/dL (ref 0.2–1.2)
Total Protein: 7 g/dL (ref 6.1–8.1)

## 2020-06-04 LAB — BASIC METABOLIC PANEL
BUN: 14 mg/dL (ref 7–25)
CO2: 31 mmol/L (ref 20–32)
Calcium: 9.9 mg/dL (ref 8.6–10.4)
Chloride: 102 mmol/L (ref 98–110)
Creat: 0.67 mg/dL (ref 0.50–1.05)
Glucose, Bld: 79 mg/dL (ref 65–99)
Potassium: 4 mmol/L (ref 3.5–5.3)
Sodium: 139 mmol/L (ref 135–146)

## 2020-06-04 LAB — LIPID PANEL
Cholesterol: 232 mg/dL — ABNORMAL HIGH (ref <200)
HDL: 67 mg/dL (ref >=50)
LDL Cholesterol (Calc): 147 mg/dL (calc) — ABNORMAL HIGH
Non-HDL Cholesterol (Calc): 165 mg/dL (calc) — ABNORMAL HIGH (ref <130)
Total CHOL/HDL Ratio: 3.5 (calc) (ref <5.0)
Triglycerides: 80 mg/dL (ref <150)

## 2020-06-04 LAB — CBC WITH DIFFERENTIAL/PLATELET
Absolute Monocytes: 327 cells/uL (ref 200–950)
Basophils Absolute: 61 cells/uL (ref 0–200)
Basophils Relative: 1.6 %
Eosinophils Absolute: 266 cells/uL (ref 15–500)
Eosinophils Relative: 7 %
HCT: 42.2 % (ref 35.0–45.0)
Hemoglobin: 14.6 g/dL (ref 11.7–15.5)
Lymphs Abs: 1239 cells/uL (ref 850–3900)
MCH: 30.8 pg (ref 27.0–33.0)
MCHC: 34.6 g/dL (ref 32.0–36.0)
MCV: 89 fL (ref 80.0–100.0)
MPV: 9.8 fL (ref 7.5–12.5)
Monocytes Relative: 8.6 %
Neutro Abs: 1908 cells/uL (ref 1500–7800)
Neutrophils Relative %: 50.2 %
Platelets: 222 10*3/uL (ref 140–400)
RBC: 4.74 10*6/uL (ref 3.80–5.10)
RDW: 12 % (ref 11.0–15.0)
Total Lymphocyte: 32.6 %
WBC: 3.8 10*3/uL (ref 3.8–10.8)

## 2020-06-04 LAB — TSH: TSH: 1.31 mIU/L

## 2020-06-06 LAB — DRUG MONITORING, PANEL 8 WITH CONFIRMATION, URINE
6 Acetylmorphine: NEGATIVE ng/mL (ref <10)
Alcohol Metabolites: NEGATIVE ng/mL
Alphahydroxyalprazolam: 58 ng/mL — ABNORMAL HIGH (ref <25)
Alphahydroxymidazolam: NEGATIVE ng/mL (ref <50)
Alphahydroxytriazolam: NEGATIVE ng/mL (ref <50)
Aminoclonazepam: NEGATIVE ng/mL (ref <25)
Amphetamines: NEGATIVE ng/mL (ref <500)
Benzodiazepines: POSITIVE ng/mL — AB (ref <100)
Buprenorphine, Urine: NEGATIVE ng/mL (ref <5)
Cocaine Metabolite: NEGATIVE ng/mL (ref <150)
Creatinine: 64.7 mg/dL
Hydroxyethylflurazepam: NEGATIVE ng/mL (ref <50)
Lorazepam: NEGATIVE ng/mL (ref <50)
MDMA: NEGATIVE ng/mL (ref <500)
Marijuana Metabolite: NEGATIVE ng/mL (ref <20)
Nordiazepam: NEGATIVE ng/mL (ref <50)
Opiates: NEGATIVE ng/mL (ref <100)
Oxazepam: NEGATIVE ng/mL (ref <50)
Oxidant: NEGATIVE ug/mL
Oxycodone: NEGATIVE ng/mL (ref <100)
Temazepam: NEGATIVE ng/mL (ref <50)
pH: 6.1 (ref 4.5–9.0)

## 2020-06-06 LAB — DM TEMPLATE

## 2020-07-17 ENCOUNTER — Ambulatory Visit: Payer: 59

## 2020-07-31 ENCOUNTER — Ambulatory Visit: Payer: No Typology Code available for payment source

## 2020-08-03 ENCOUNTER — Encounter: Payer: Self-pay | Admitting: Family

## 2020-09-02 LAB — COLOGUARD: Cologuard: NEGATIVE

## 2020-09-04 ENCOUNTER — Ambulatory Visit
Admission: RE | Admit: 2020-09-04 | Discharge: 2020-09-04 | Disposition: A | Discharge status: Home / Self Care | Payer: No Typology Code available for payment source | Source: Ambulatory Visit | Attending: Obstetrics and Gynecology | Admitting: Obstetrics and Gynecology

## 2020-09-04 ENCOUNTER — Other Ambulatory Visit: Payer: Self-pay

## 2020-09-04 DIAGNOSIS — Z1231 Encounter for screening mammogram for malignant neoplasm of breast: Secondary | ICD-10-CM

## 2020-09-07 LAB — COLOGUARD: COLOGUARD: NEGATIVE

## 2020-12-11 ENCOUNTER — Telehealth: Payer: Self-pay

## 2020-12-11 NOTE — Telephone Encounter (Signed)
Patient evaluated/treated in ER on 12/10/2020. Has F/U with PCP on 12/14/2020.    [facility] [place] [facility] - Client TELEPHONE ADVICE RECORD AccessNurse Patient Name: [name] Gender: Female DOB: [birth year] Age: 50 Y 6 M Return Phone Number: [phone] (Primary), [phone] (Secondary) Address: City/State/Zip: [place] [address] Client [facility] [place] [facility] - Client Client Site [facility] [place] - Day Physician Sandford Craze - NP Contact Type Call Who Is Calling Patient / Member / Family / Caregiver Call Type Triage / Clinical Relationship To Patient Self Return Phone Number [phone] (Primary) Chief Complaint SPEAK - sudden inability to talk or slurred speech Reason for Call Symptomatic / Request for Health Information Initial Comment Caller states she has sharp pains in her shins. She works from home and sits a lot. She feels like she is slurring her words and cannot pronounce correctly. Translation No Nurse Assessment Nurse: Shawnie Dapper, RN, Arline Asp Date/Time (Eastern Time): 12/10/2020 12:57:39 PM Confirm and document reason for call. If symptomatic, describe symptoms. ---Caller states that in the last week and a half has been getting tongue tied. Has been under alot of stress with her work. Speech and pronunciation has been an issue for her since she was younger. Does the patient have any new or worsening symptoms? ---Yes Will a triage be completed? ---Yes Related visit to physician within the last 2 weeks? ---No Does the PT have any chronic conditions? (i.e. diabetes, asthma, this includes High risk factors for pregnancy, etc.) ---No Is the patient pregnant or possibly pregnant? (Ask all females between the ages of 37-55) ---No Is this a behavioral health or substance abuse call? ---No Guidelines Guideline Title Affirmed Question Affirmed Notes Nurse Date/Time (Eastern Time) Neurologic Deficit [1] Loss of speech  or garbled speech AND [2] sudden onset AND [3] brief (now gone) Shawnie Dapper, Charity fundraiser, [facility] 12/10/2020 1:02:12 PM Disp. Time Lamount Cohen Time) Disposition Final User 12/10/2020 12:54:57 PM Send to Urgent Marney Doctor, Stefanie 12/10/2020 1:08:18 PM Send to Urgent Burt Knack, RN, Lossie Faes NOTE: All timestamps contained within this report are represented as [place] Standard Time. CONFIDENTIALTY NOTICE: This fax transmission is intended only for the addressee. It contains information that is legally privileged, confidential or otherwise protected from use or disclosure. If you are not the intended recipient, you are strictly prohibited from reviewing, disclosing, copying using or disseminating any of this information or taking any action in reliance on or regarding this information. If you have received this fax in error, please notify us immediately by telephone so that we can arrange for its return to [place]. Phone: [phone], Toll-Free: [phone], Fax: [phone] Page: 2 of 2 Call Id: [id number] 12/10/2020 1:08:04 PM Go to ED Now (or PCP triage) Yes Shawnie Dapper, RN, Ave Filter Disagree/Comply Comply Caller Understands Yes PreDisposition Go to ED Care Advice Given Per Guideline GO TO ED NOW (OR PCP TRIAGE): ANOTHER ADULT SHOULD DRIVE: * It is better and safer if another adult drives instead of you. CARE ADVICE given per Neurologic Deficit (Adult) guideline. Comments User: Christy Gentles, RN Date/Time Lamount Cohen Time): 12/10/2020 1:00:34 PM is taking hormones for Menopause

## 2020-12-14 ENCOUNTER — Telehealth: Payer: Self-pay | Admitting: Family

## 2020-12-14 ENCOUNTER — Other Ambulatory Visit: Payer: Self-pay

## 2020-12-14 ENCOUNTER — Ambulatory Visit (INDEPENDENT_AMBULATORY_CARE_PROVIDER_SITE_OTHER): Payer: No Typology Code available for payment source | Admitting: Family

## 2020-12-14 VITALS — BP 116/67 | HR 100 | Temp 98.1°F | Resp 16 | Ht 61.0 in | Wt 140.0 lb

## 2020-12-14 DIAGNOSIS — F419 Anxiety disorder, unspecified: Secondary | ICD-10-CM | POA: Diagnosis not present

## 2020-12-14 NOTE — Progress Notes (Signed)
Subjective:    Patient ID: [name], female    DOB: [birth year], 50 y.o.   MRN: [id number]  HPI  Patient is a 50 year old female who presents today with several concerns.  She went to the emergency department at [facility] on December 03, 2020.  At that time she complained of aphasia and headache.  She also reported intermittent difficulty articulating herself for the past week prior to her visit to the ED.  She only reported sleeping about 4 hours a night at that time.  She was concerned about the possibility of stroke and therefore presented to the ED.  Work-up in the emergency department included a right lower extremity Doppler for leg pain which was negative for DVT,  Reports that She presents a lot on camera.  Has been working "ridiculous hours" the last 3 weeks.  Increased stress. Was having some trouble articulating her words.  Sister had seizures as a child which worried her.  She called triage nurse and went to the ER.    She also was having a shooting pain down the right leg.  CT head which showed no acute abnormality.   She is no longer taking anxiety medication.  Her gyn put her on HRT. She has had some weight gain.   Wt Readings from Last 3 Encounters:  12/14/20 140 lb (63.5 kg)  06/03/20 129 lb (58.5 kg)  08/07/18 133 lb (60.3 kg)     Review of Systems See HPI  Past Medical History:  Diagnosis Date  . Depression   . DUB (dysfunctional uterine bleeding)   . Keratoconus   . Medical history non-contributory   . PONV (postoperative nausea and vomiting)      Social History   Socioeconomic History  . Marital status: Single    Spouse name: Not on file  . Number of children: Not on file  . Years of education: Not on file  . Highest education level: Not on file  Occupational History  . Not on file  Tobacco Use  . Smoking status: Never Smoker  . Smokeless tobacco: Never Used  Substance and Sexual Activity  . Alcohol use: Yes    Alcohol/week: 3.0 - 4.0 standard  drinks    Types: 3 - 4 Glasses of wine per week    Comment: occasional  . Drug use: No  . Sexual activity: Not Currently  Other Topics Concern  . 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 CESAREAN SECTION     1997 & 2000  . corneal surgery  2019  . DILATION AND CURETTAGE OF UTERUS    . DILITATION & CURRETTAGE/HYSTROSCOPY WITH NOVASURE ABLATION N/A 08/26/2013   Procedure: DILATATION & CURETTAGE/HYSTEROSCOPY WITH NOVASURE ABLATION;  Surgeon: Lenoard Aden, MD;  Location: [facility];  Service: Gynecology;  Laterality: N/A;  . LAPAROSCOPIC TUBAL LIGATION Bilateral 08/26/2013   Procedure: LAPAROSCOPIC TUBAL LIGATION;  Surgeon: Lenoard Aden, MD;  Location: [facility];  Service: Gynecology;  Laterality: Bilateral;  . TUBAL LIGATION  08/2013    Family History  Problem Relation Age  of Onset  . Uterine cancer Mother   . Throat cancer Father        cigar smoker  . Cancer Father        throat and prostate  . Cancer Maternal Aunt 75       breast  . Breast cancer Maternal Aunt   . CVA Maternal Aunt   . Hypertension Sister   . CVA Maternal Grandmother        brain hemorrhage  . Lung cancer Maternal Grandfather        smoker  . Stomach cancer Paternal Grandmother   . Liver cancer Paternal Grandfather     No Known Allergies  Current Outpatient Medications on File Prior to Visit  Medication Sig Dispense Refill  . ALPRAZolam (XANAX) 0.5 MG tablet Take 1 tablet (0.5 mg total) by mouth 2 (two) times daily as needed. 30 tablet 0  . estradiol (ESTRACE) 1 MG tablet Take 1 mg by mouth daily.    . progesterone  (PROMETRIUM) 100 MG capsule Take 100 mg by mouth at bedtime.    Marland Kitchen venlafaxine (EFFEXOR) 37.5 MG tablet      No current facility-administered medications on file prior to visit.    BP 116/67 (BP Location: Right Arm, Patient Position: Sitting, Cuff Size: Small)   Pulse 100   Temp 98.1 F (36.7 C) (Temporal)   Resp 16   Ht 5\' 1"  (1.549 m)   Wt 140 lb (63.5 kg)   SpO2 100%   BMI 26.45 kg/m       Objective:   Physical Exam Constitutional:      Appearance: She is well-developed.  Neck:     Thyroid: No thyromegaly.  Cardiovascular:     Rate and Rhythm: Normal rate and regular rhythm.     Heart sounds: Normal heart sounds. No murmur heard.   Pulmonary:     Effort: Pulmonary effort is normal. No respiratory distress.     Breath sounds: Normal breath sounds. No wheezing.  Musculoskeletal:     Cervical back: Neck supple.  Skin:    General: Skin is warm and dry.  Neurological:     Mental Status: She is alert and oriented to person, place, and time.  Psychiatric:        Behavior: Behavior normal.        Thought Content: Thought content normal.        Judgment: Judgment normal.           Assessment & Plan:  Anxiety- I suspect that her articulation problems are related to stress/anxiety/fatigue. She does not wish to resume medication for anxiety.    We discussed things to do to reduce stress. ED work up was reassuring. Will continue to monitor for now.   21 minutes spent on today's visit. Time was spent reviewing patient record and counseling patient.  This visit occurred during the SARS-CoV-2 public health emergency.  Safety protocols were in place, including screening questions prior to the visit, additional usage of staff PPE, and extensive cleaning of exam room while observing appropriate contact time as indicated for disinfecting solutions.

## 2020-12-14 NOTE — Telephone Encounter (Signed)
Can you please call cologuard and request a copy of her results?

## 2020-12-15 NOTE — Telephone Encounter (Signed)
Copy of report requested from exact sciences lab. Representative verbalized results as negative.

## 2020-12-16 NOTE — Telephone Encounter (Signed)
Results received and abstracted as negative

## 2021-06-08 ENCOUNTER — Other Ambulatory Visit: Payer: Self-pay

## 2021-06-08 ENCOUNTER — Ambulatory Visit (INDEPENDENT_AMBULATORY_CARE_PROVIDER_SITE_OTHER): Payer: No Typology Code available for payment source | Admitting: Family

## 2021-06-08 ENCOUNTER — Encounter: Payer: Self-pay | Admitting: Family

## 2021-06-08 VITALS — BP 114/72 | HR 68 | Temp 98.0°F | Resp 16 | Ht 61.5 in | Wt 114.0 lb

## 2021-06-08 DIAGNOSIS — E785 Hyperlipidemia, unspecified: Secondary | ICD-10-CM

## 2021-06-08 DIAGNOSIS — Z0000 Encounter for general adult medical examination without abnormal findings: Secondary | ICD-10-CM

## 2021-06-08 DIAGNOSIS — Z23 Encounter for immunization: Secondary | ICD-10-CM | POA: Diagnosis not present

## 2021-06-08 LAB — COMPREHENSIVE METABOLIC PANEL
ALT: 12 U/L (ref 0–35)
AST: 17 U/L (ref 0–37)
Albumin: 4.4 g/dL (ref 3.5–5.2)
Alkaline Phosphatase: 64 U/L (ref 39–117)
BUN: 19 mg/dL (ref 6–23)
CO2: 30 mEq/L (ref 19–32)
Calcium: 9.8 mg/dL (ref 8.4–10.5)
Chloride: 100 mEq/L (ref 96–112)
Creatinine, Ser: 0.7 mg/dL (ref 0.40–1.20)
GFR: 99.74 mL/min (ref >60.00)
Glucose, Bld: 90 mg/dL (ref 70–99)
Potassium: 4.4 mEq/L (ref 3.5–5.1)
Sodium: 137 mEq/L (ref 135–145)
Total Bilirubin: 0.6 mg/dL (ref 0.2–1.2)
Total Protein: 6.9 g/dL (ref 6.0–8.3)

## 2021-06-08 LAB — LIPID PANEL
Cholesterol: 195 mg/dL (ref 0–200)
HDL: 64.4 mg/dL (ref >39.00)
LDL Cholesterol: 113 mg/dL — ABNORMAL HIGH (ref 0–99)
NonHDL: 130.83
Total CHOL/HDL Ratio: 3
Triglycerides: 87 mg/dL (ref 0.0–149.0)
VLDL: 17.4 mg/dL (ref 0.0–40.0)

## 2021-06-08 MED ORDER — TRAZODONE HCL 50 MG PO TABS: 25.0000 mg | ORAL_TABLET | Freq: Every evening | ORAL | 3 refills | Status: DC | PRN

## 2021-06-08 NOTE — Assessment & Plan Note (Addendum)
She is working maintaining her weight now that she has reached her goal. I commended her on her hard work and weight loss success. Mammo/pap up to date.  Cologuard up to date. Flu shot today. Recommended new covid booster when it becomes available.  She will schedule shingrix #1 for another day.

## 2021-06-08 NOTE — Progress Notes (Signed)
Subjective:     Patient ID: [name], female    DOB: 1971/01/15, 50 y.o.   MRN: [id number]  Chief Complaint  Patient presents with   Annual Exam         HPI  Patient presents today for complete physical.  Immunizations: Td 2019,  has had 2 moderna Diet: She has been doing optavia diet.  She is trying to maintain.   Wt Readings from Last 3 Encounters:  06/08/21 114 lb (51.7 kg)  12/14/20 140 lb (63.5 kg)  06/03/20 129 lb (58.5 kg)  Exercise: 2 hrs a day- running and pur barr Colonoscopy:  Cologuard  Pap Smear: 11/28/17- followed by [facility]. Mammogram:12/21 Vision:  up to date- sees Dr. Sharen Counter Dental: up to date   Health Maintenance Due  Topic Date Due   Hepatitis C Screening  Never done   Zoster Vaccines- Shingrix (1 of 2) Never done   COVID-19 Vaccine (3 - Booster for Moderna series) 11/04/2020   PAP SMEAR-Modifier  11/28/2020    Past Medical History:  Diagnosis Date   Depression    DUB (dysfunctional uterine bleeding)    Keratoconus    Medical history non-contributory    PONV (postoperative nausea and vomiting)     Past Surgical History:  Procedure Laterality Date   CESAREAN SECTION     1997 & 2000   corneal surgery  2019   DILATION AND CURETTAGE OF UTERUS     DILITATION & CURRETTAGE/HYSTROSCOPY WITH NOVASURE ABLATION N/A 08/26/2013   Procedure: DILATATION & CURETTAGE/HYSTEROSCOPY WITH NOVASURE ABLATION;  Surgeon: Lovenia Kim, MD;  Location: [facility];  Service: Gynecology;  Laterality: N/A;   LAPAROSCOPIC TUBAL LIGATION Bilateral 08/26/2013   Procedure: LAPAROSCOPIC TUBAL LIGATION;  Surgeon: Lovenia Kim, MD;  Location: [facility];  Service: Gynecology;  Laterality: Bilateral;   TUBAL LIGATION  08/2013    Family History  Problem Relation Age of Onset   Uterine cancer Mother    Throat cancer Father        cigar smoker   Cancer Father        throat and prostate   Hypertension Sister        had VP shunt placed for increased ICP    CVA Maternal Grandmother        brain hemorrhage   Lung cancer Maternal Grandfather        smoker   Stomach cancer Paternal Grandmother    Liver cancer Paternal Grandfather    Cancer Maternal Aunt 75       breast   Breast cancer Maternal Aunt    CVA Maternal Aunt     Social History   Socioeconomic History   Marital status: Single    Spouse name: Not on file   Number of children: Not on file   Years of education: Not on file   Highest education level: Not on file  Occupational History   Not on file  Tobacco Use   Smoking status: Never   Smokeless tobacco: Never  Substance and Sexual Activity   Alcohol use: Yes    Alcohol/week: 3.0 - 4.0 standard drinks    Types: 3 - 4 Glasses of wine per week    Comment: occasional   Drug use: No   Sexual activity: Not Currently  Other Topics Concern   Not on file  Social History 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     progesterone (PROMETRIUM) 100 MG capsule Take 100 mg by mouth at bedtime.     No facility-administered medications prior to visit.    No Known Allergies  Review of Systems  Constitutional:  Positive for weight loss (on purpose).  HENT:  Negative for congestion.   Eyes:  Negative for blurred vision.  Respiratory:  Negative for cough.   Cardiovascular:  Negative for leg swelling.  Gastrointestinal:  Negative for constipation and diarrhea.  Genitourinary:  Negative for dysuria, frequency and hematuria.  Musculoskeletal:  Negative for joint pain and myalgias.  Skin:  Negative for  rash.  Neurological:  Negative for dizziness and headaches.  Psychiatric/Behavioral:         Denies depression Working at home is challenging Sleeping has been more difficult lately. Uses OTC melatonin      Objective:    Physical Exam  BP 114/72 (BP Location: Right Arm, Patient Position: Sitting, Cuff Size: Small)   Pulse 68   Temp 98 F (36.7 C) (Oral)   Resp 16   Ht 5' 1.5" (1.562 m)   Wt 114 lb (51.7 kg)   SpO2 100%   BMI 21.19 kg/m  Wt Readings from Last 3 Encounters:  06/08/21 114 lb (51.7 kg)  12/14/20 140 lb (63.5 kg)  06/03/20 129 lb (58.5 kg)   Physical Exam  Constitutional: She is oriented to person, place, and time. She appears well-developed and well-nourished. No distress.  HENT:  Head: Normocephalic and atraumatic.  Right Ear: Tympanic membrane and ear canal normal.  Left Ear: Tympanic membrane and ear canal normal.  Mouth/Throat: Not examined- pt wearing mask Eyes: Pupils are equal, round, and reactive to light. No scleral icterus.  Neck: Normal range of motion. No thyromegaly present.  Cardiovascular: Normal rate and regular rhythm.   No murmur heard. Pulmonary/Chest: Effort normal and breath sounds normal. No respiratory distress. He has no wheezes. She has no rales. She exhibits no tenderness.  Abdominal: Soft. Bowel sounds are normal. She exhibits no distension and no mass. There is no tenderness. There is no rebound and no guarding.  Musculoskeletal: She exhibits no edema.  Lymphadenopathy:    She has no cervical adenopathy.  Neurological: She is alert and oriented to person, place, and time. She has normal patellar reflexes. She exhibits normal muscle tone. Coordination normal.  Skin: Skin is warm and dry.  Psychiatric: She has a normal mood and affect. Her behavior is normal. Judgment and thought content normal.  Breast/pelvic: deferred           Assessment & Plan:       Assessment & Plan:   Problem List Items Addressed This Visit        Unprioritized   Preventative health care - Primary    She is working maintaining her weight now that she has reached her goal. I commended her on her hard work and weight loss success. Mammo/pap up to date.  Cologuard up to date. Flu shot today. Recommended new covid booster when it becomes available.  She will schedule shingrix #1 for another day.  Other Visit Diagnoses     Needs flu shot       Relevant Orders   Flu Vaccine QUAD 6+ mos PF IM (Fluarix Quad PF) (Completed)   Hyperlipidemia, unspecified hyperlipidemia type       Relevant Orders   Comp Met (CMET)   Lipid panel       I am having Mel Almond "Kim" start on traZODone. I am also having her maintain her estradiol and progesterone.  Meds ordered this encounter  Medications   traZODone (DESYREL) 50 MG tablet    Sig: Take 0.5-1 tablets (25-50 mg total) by mouth at bedtime as needed for sleep.    Dispense:  30 tablet    Refill:  3    Order Specific Question:   Supervising Provider    Answer:   Penni Homans A [[id number]]

## 2021-06-30 ENCOUNTER — Other Ambulatory Visit: Payer: Self-pay | Admitting: Family

## 2021-07-09 ENCOUNTER — Ambulatory Visit: Payer: No Typology Code available for payment source

## 2021-10-01 ENCOUNTER — Other Ambulatory Visit: Payer: Self-pay | Admitting: Family

## 2021-10-21 ENCOUNTER — Other Ambulatory Visit: Payer: Self-pay | Admitting: Obstetrics and Gynecology

## 2021-10-21 DIAGNOSIS — Z1231 Encounter for screening mammogram for malignant neoplasm of breast: Secondary | ICD-10-CM

## 2021-11-05 ENCOUNTER — Ambulatory Visit
Admission: RE | Admit: 2021-11-05 | Discharge: 2021-11-05 | Disposition: A | Discharge status: Home / Self Care | Payer: No Typology Code available for payment source | Source: Ambulatory Visit

## 2021-11-05 DIAGNOSIS — Z1231 Encounter for screening mammogram for malignant neoplasm of breast: Secondary | ICD-10-CM

## 2021-11-08 ENCOUNTER — Other Ambulatory Visit: Payer: Self-pay | Admitting: Obstetrics and Gynecology

## 2021-11-08 DIAGNOSIS — R928 Other abnormal and inconclusive findings on diagnostic imaging of breast: Secondary | ICD-10-CM

## 2021-11-24 ENCOUNTER — Ambulatory Visit
Admission: RE | Admit: 2021-11-24 | Discharge: 2021-11-24 | Disposition: A | Discharge status: Home / Self Care | Payer: No Typology Code available for payment source | Source: Ambulatory Visit | Attending: Obstetrics and Gynecology | Admitting: Obstetrics and Gynecology

## 2021-11-24 ENCOUNTER — Ambulatory Visit: Payer: No Typology Code available for payment source

## 2021-11-24 DIAGNOSIS — R928 Other abnormal and inconclusive findings on diagnostic imaging of breast: Secondary | ICD-10-CM

## 2022-02-21 ENCOUNTER — Ambulatory Visit (INDEPENDENT_AMBULATORY_CARE_PROVIDER_SITE_OTHER): Payer: No Typology Code available for payment source | Admitting: Family

## 2022-02-21 VITALS — BP 107/56 | HR 55 | Temp 97.9°F | Resp 16 | Wt 116.0 lb

## 2022-02-21 DIAGNOSIS — F4323 Adjustment disorder with mixed anxiety and depressed mood: Secondary | ICD-10-CM

## 2022-02-21 DIAGNOSIS — L989 Disorder of the skin and subcutaneous tissue, unspecified: Secondary | ICD-10-CM | POA: Diagnosis not present

## 2022-02-21 DIAGNOSIS — H9311 Tinnitus, right ear: Secondary | ICD-10-CM | POA: Insufficient documentation

## 2022-02-21 MED ORDER — LORATADINE 10 MG PO TABS: 10.0000 mg | ORAL_TABLET | Freq: Every day | ORAL | 11 refills | Status: DC

## 2022-02-21 MED ORDER — FLUTICASONE PROPIONATE 50 MCG/ACT NA SUSP: 2.0000 | Freq: Every day | NASAL | 6 refills | Status: DC

## 2022-02-21 NOTE — Assessment & Plan Note (Signed)
Having intermittent anxiety episodes. She is hoping not to be on a daily ssri or prn xanax as it makes her too sleepy.  Recommended that she continue healthy diet, regular exercise and add counseling. We discussed trial of a natural lavender supplement as well such as Calm Aid.

## 2022-02-21 NOTE — Assessment & Plan Note (Signed)
New.  Refer to dermatology. 

## 2022-02-21 NOTE — Progress Notes (Signed)
Subjective:   By signing my name below, I, Zite Okoli, attest that this documentation has been prepared under the direction and in the presence of Sandford Craze, NP 02/21/2022       Patient ID: [name], female    DOB: 1971/08/17, 51 y.o.   MRN: [id number]  Chief Complaint  Patient presents with   Tinnitus    Complains of ringing in ears, mostly right.     HPI Patient is in today for an office visit.  Tinnitus- She reports that after she came back from the beach, there has been tinnitus in both ears accompanied with ear pain. She notes that it feels like "swimmer's ears" sometimes with right worse than left. She adds she has not experienced it in the past 3 days.   Skin- She would like to see a dermatologist because there are several lesions on her face that have been persistent. She has tried treretinoin and it has not helped.  Anxiety- She reports that her anxiety is starting to increase. She has not tried any medication. She was on medications to treat the anxiety years ago and is not interested in restarting since it took a while to wean off.  She identifies work and relationships as triggers and she is able to tell when it starts. Notes that it is intermittent but some days, she often has to catch herself. She works from home and goes to the gym frequently.   Past Medical History:  Diagnosis Date   Depression    DUB (dysfunctional uterine bleeding)    Keratoconus    Medical history non-contributory    PONV (postoperative nausea and vomiting)     Past Surgical History:  Procedure Laterality Date   CESAREAN SECTION     1997 & 2000   corneal surgery  2019   DILATION AND CURETTAGE OF UTERUS     DILITATION & CURRETTAGE/HYSTROSCOPY WITH NOVASURE ABLATION N/A 08/26/2013   Procedure: DILATATION & CURETTAGE/HYSTEROSCOPY WITH NOVASURE ABLATION;  Surgeon: Lenoard Aden, MD;  Location: [facility];  Service: Gynecology;  Laterality: N/A;   LAPAROSCOPIC TUBAL LIGATION  Bilateral 08/26/2013   Procedure: LAPAROSCOPIC TUBAL LIGATION;  Surgeon: Lenoard Aden, MD;  Location: [facility];  Service: Gynecology;  Laterality: Bilateral;   TUBAL LIGATION  08/2013    Family History  Problem Relation Age of Onset   Uterine cancer Mother    Throat cancer Father        cigar smoker   Cancer Father        throat and prostate   Hypertension Sister        had VP shunt placed for increased ICP   Cancer Maternal Aunt 59       breast   Breast cancer Maternal Aunt        late 48's or early 14's   CVA Maternal Aunt    CVA Maternal Grandmother        brain hemorrhage   Lung cancer Maternal Grandfather        smoker   Stomach cancer Paternal Grandmother    Liver cancer Paternal Grandfather     Social History   Socioeconomic History   Marital status: Single    Spouse name: Not on file   Number of children: Not on file   Years of education: Not on file   Highest education level: Not on file  Occupational History   Not on file  Tobacco Use   Smoking status: Never   Smokeless  tobacco: Never  Substance and Sexual Activity   Alcohol use: Yes    Alcohol/week: 3.0 - 4.0 standard drinks    Types: 3 - 4 Glasses of wine per week    Comment: occasional   Drug use: No   Sexual activity: Not Currently  Other Topics Concern   Not on file  Social History 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     progesterone (PROMETRIUM) 100 MG capsule Take 100 mg by mouth at bedtime.     traZODone (DESYREL) 50 MG tablet TAKE 1/2 TO 1 TABLET BY MOUTH  AT BEDTIME AS NEEDED FOR SLEEP 90 tablet 0   No facility-administered medications prior to visit.    No Known Allergies  Review of Systems  HENT:  Positive for ear pain and tinnitus.   Psychiatric/Behavioral:  The patient is nervous/anxious.       Objective:    Physical Exam Constitutional:      General: She is not in acute distress.    Appearance: Normal appearance. She is not ill-appearing.  HENT:     Head: Normocephalic and atraumatic.     Right Ear: Tympanic membrane and external ear normal. Tympanic membrane is not erythematous.     Left Ear: Tympanic membrane and external ear normal. Tympanic membrane is not erythematous.     Ears:     Comments: Minimal fluid in both ears  Cardiovascular:     Rate and Rhythm: Normal rate and regular rhythm.  Skin:    General: Skin is warm and dry.     Findings: Lesion present.     Comments: Several scaly lesions on forehead   Neurological:     Mental Status: She is alert and oriented to person, place, and time.  Psychiatric:        Behavior: Behavior normal.        Judgment: Judgment normal.    BP (!) 107/56 (BP Location: Right Arm, Patient Position: Sitting, Cuff Size: Small)   Pulse (!) 55   Temp 97.9 F (36.6 C) (Oral)   Resp 16   Wt 116 lb (52.6 kg)   SpO2 100%   BMI 21.56 kg/m  Wt Readings from Last 3 Encounters:  02/21/22 116 lb (52.6 kg)  06/08/21 114 lb (51.7 kg)  12/14/20 140 lb (63.5 kg)    Diabetic Foot Exam - Simple   No data filed    Lab Results  Component Value Date   WBC 3.8 06/03/2020   HGB 14.6 06/03/2020   HCT 42.2 06/03/2020   PLT 222 06/03/2020   GLUCOSE 90 06/08/2021   CHOL 195 06/08/2021   TRIG 87.0 06/08/2021   HDL 64.40 06/08/2021   LDLCALC 113 (H) 06/08/2021   ALT 12 06/08/2021   AST 17 06/08/2021   NA 137 06/08/2021   K 4.4 06/08/2021   CL 100 06/08/2021   CREATININE 0.70 06/08/2021   BUN 19 06/08/2021   CO2 30 06/08/2021   TSH 1.31 06/03/2020   PSA negative 11/28/2017  Lab Results  Component Value Date   TSH 1.31 06/03/2020   Lab Results  Component Value Date   WBC 3.8 06/03/2020   HGB 14.6 06/03/2020   HCT 42.2 06/03/2020   MCV 89.0 06/03/2020   PLT 222 06/03/2020   Lab Results  Component Value Date   NA 137 06/08/2021   K 4.4 06/08/2021   CO2 30 06/08/2021   GLUCOSE 90 06/08/2021   BUN 19 06/08/2021   CREATININE 0.70 06/08/2021   BILITOT 0.6 06/08/2021   ALKPHOS 64 06/08/2021   AST 17 06/08/2021   ALT 12 06/08/2021   PROT 6.9 06/08/2021   ALBUMIN 4.4 06/08/2021   CALCIUM 9.8 06/08/2021   ANIONGAP 9 10/28/2015   GFR 99.74 06/08/2021   Lab Results  Component Value Date   CHOL 195 06/08/2021   Lab Results  Component Value Date   HDL 64.40 06/08/2021   Lab Results  Component Value Date   LDLCALC 113 (H) 06/08/2021   Lab Results  Component Value Date   TRIG 87.0 06/08/2021   Lab Results  Component Value Date   CHOLHDL 3 06/08/2021   No results found for: HGBA1C     Assessment & Plan:   Problem List Items Addressed This Visit       Unprioritized   Tinnitus aurium, right    Mild serous effusions bilaterally. Recommended trial of claritin 10mg  once daily and flonase 2 sprays each nostril once daily.        Skin lesion of face - Primary    New. Refer to dermatology.        Relevant Orders   Ambulatory referral to Dermatology   Adjustment disorder with mixed anxiety and depressed mood    Having intermittent anxiety episodes. She is hoping not to be on a daily ssri or prn xanax as it makes her too sleepy.  Recommended that she continue healthy diet, regular exercise and add counseling. We discussed trial of a natural lavender supplement as well such as Calm Aid.         Meds ordered this encounter  Medications   loratadine (CLARITIN) 10 MG tablet    Sig: Take 1 tablet (10 mg total) by mouth daily.    Dispense:  30 tablet    Refill:  11    Order Specific Question:   Supervising Provider    Answer:    Danise EdgeBLYTH, STACEY A [[id number]]   fluticasone (FLONASE) 50 MCG/ACT nasal spray    Sig: Place 2 sprays into both nostrils daily.    Dispense:  16 g    Refill:  6    Order Specific Question:   Supervising Provider    Answer:   Danise EdgeBLYTH, STACEY A [[id number]]    I,Zite Okoli,acting as a scribe for Lemont FillersMelissa S O'Sullivan, NP.,have documented all relevant documentation on the behalf of Lemont FillersMelissa S O'Sullivan, NP,as directed by  Lemont FillersMelissa S O'Sullivan, NP while in the presence of Lemont FillersMelissa S O'Sullivan, NP.   I, Sandford Craze'Sullivan, Jolaine Fryberger, NP, personally preformed the services described in this documentation.  All medical record entries made by the scribe were at my direction and in my presence.  I have reviewed the chart and discharge instructions (if applicable) and agree that the record reflects my personal performance and is accurate and complete. 02/21/2022

## 2022-02-21 NOTE — Patient Instructions (Signed)
You can try adding Calm Aid for anxiety (lavender supplement) available on [place]. Consider starting counseling.

## 2022-02-21 NOTE — Assessment & Plan Note (Signed)
Mild serous effusions bilaterally. Recommended trial of claritin 10mg  once daily and flonase 2 sprays each nostril once daily.

## 2022-06-10 ENCOUNTER — Encounter: Payer: No Typology Code available for payment source | Admitting: Family

## 2023-01-06 ENCOUNTER — Other Ambulatory Visit: Payer: Self-pay | Admitting: Obstetrics and Gynecology

## 2023-01-06 DIAGNOSIS — Z1231 Encounter for screening mammogram for malignant neoplasm of breast: Secondary | ICD-10-CM

## 2023-01-20 LAB — HM PAP SMEAR
HM Pap smear: NORMAL
HPV, high-risk: NEGATIVE

## 2023-02-14 ENCOUNTER — Ambulatory Visit
Admission: RE | Admit: 2023-02-14 | Discharge: 2023-02-14 | Disposition: A | Discharge status: Home / Self Care | Payer: No Typology Code available for payment source | Source: Ambulatory Visit | Attending: Obstetrics and Gynecology | Admitting: Obstetrics and Gynecology

## 2023-02-14 DIAGNOSIS — Z1231 Encounter for screening mammogram for malignant neoplasm of breast: Secondary | ICD-10-CM

## 2023-02-23 NOTE — Progress Notes (Signed)
Subjective:   By signing my name below, I, [name], attest that this documentation has been prepared under the direction and in the presence of Sue Fillers, NP 02/24/23   Patient ID: [name], female    DOB: [birth year], 52 y.o.   MRN: [id number]  No chief complaint on file.   HPI Patient is in today for a comprehensive physical exam.   Acute: She denies having any fever, new muscle pain, new joint pain, new moles, congestion, sinus pain, sore throat, chest pain, palpitations, cough, SOB, wheezing, n/v/d, constipation, blood in stool, dysuria, frequency, hematuria, or headaches at this time.   Immunizations: She expressed hesitancy about receiving the shingles vaccine at this time. She is UTD with Tetanus vaccine.   Diet/Exercise: She is maintaining a healthy diet. She stays active and exercises 5x a week.   Last colonoscopy: She has not had a colonoscopy. She did a cologuard about 3 years ago.   Last mammogram: 02/14/2023. Results were normal.   Last pap: 01/2023 with Dr. Billy Ponce. Results were normal.   Vision/Dental: She is UTD on routine vision and dental care.  Past Medical History:  Diagnosis Date   Depression    DUB (dysfunctional uterine bleeding)    Keratoconus    Medical history non-contributory    PONV (postoperative nausea and vomiting)     Past Surgical History:  Procedure Laterality Date   CESAREAN SECTION     1997 & 2000   corneal surgery  2019   DILATION AND CURETTAGE OF UTERUS     DILITATION & CURRETTAGE/HYSTROSCOPY WITH NOVASURE ABLATION N/A 08/26/2013   Procedure: DILATATION & CURETTAGE/HYSTEROSCOPY WITH NOVASURE ABLATION;  Surgeon: Sue Aden, MD;  Location: [facility];  Service: Gynecology;  Laterality: N/A;   LAPAROSCOPIC TUBAL LIGATION Bilateral 08/26/2013   Procedure: LAPAROSCOPIC TUBAL LIGATION;  Surgeon: Sue Aden, MD;  Location: [facility];  Service: Gynecology;  Laterality: Bilateral;   TUBAL LIGATION  08/2013     Family History  Problem Relation Age of Onset   Uterine cancer Mother    Throat cancer Father        cigar smoker   Cancer Father        throat and prostate   Hypertension Sister        had VP shunt placed for increased ICP   Cancer Maternal Aunt 70       breast   Breast cancer Maternal Aunt        late 90's or early 43's   CVA Maternal Aunt    CVA Maternal Grandmother        brain hemorrhage   Lung cancer Maternal Grandfather        smoker   Stomach cancer Paternal Grandmother    Liver cancer Paternal Grandfather     Social History   Socioeconomic History   Marital status: Single    Spouse name: Not on file   Number of children: Not on file   Years of education: Not on file   Highest education level: Not on file  Occupational History   Not on file  Tobacco Use   Smoking status: Never   Smokeless tobacco: Never  Substance and Sexual Activity   Alcohol use: Yes    Alcohol/week: 3.0 - 4.0 standard drinks of alcohol    Types: 3 - 4 Glasses of wine per week    Comment: occasional   Drug use: No   Sexual activity: Not Currently  Other Topics Concern  Not on file  Social History 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     progesterone (PROMETRIUM) 100 MG capsule Take 100 mg by mouth at bedtime.     fluticasone (FLONASE) 50 MCG/ACT nasal spray Place 2 sprays into both nostrils daily. 16 g 6   loratadine (CLARITIN) 10 MG tablet Take 1 tablet (10 mg total) by mouth daily. 30 tablet 11   traZODone (DESYREL) 50 MG tablet TAKE 1/2 TO 1  TABLET BY MOUTH AT BEDTIME AS NEEDED FOR SLEEP 90 tablet 0   No facility-administered medications prior to visit.    No Known Allergies  ROS See HPI    Objective:    Physical Exam Constitutional:      General: She is not in acute distress.    Appearance: Normal appearance. She is not ill-appearing.  HENT:     Head: Normocephalic and atraumatic.     Right Ear: Tympanic membrane, ear canal and external ear normal.     Left Ear: Tympanic membrane, ear canal and external ear normal.  Eyes:     Extraocular Movements: Extraocular movements intact.     Right eye: No nystagmus.     Left eye: No nystagmus.     Pupils: Pupils are equal, round, and reactive to light.  Neck:     Thyroid: No thyroid tenderness.  Cardiovascular:     Rate and Rhythm: Normal rate and regular rhythm.     Heart sounds: Normal heart sounds. No murmur heard.    No gallop.  Pulmonary:     Effort: Pulmonary effort is normal. No respiratory distress.     Breath sounds: Normal breath sounds. No wheezing or rales.  Abdominal:     General: There is no distension.     Palpations: Abdomen is soft.     Tenderness: There is no abdominal tenderness. There is no guarding.  Musculoskeletal:     Comments: 5/5 strength in both upper and lower extremities  Lymphadenopathy:     Cervical: No cervical adenopathy.  Skin:    General: Skin is warm and dry.  Neurological:     Mental Status: She is alert and oriented to person, place, and time.     Deep Tendon Reflexes:     Reflex Scores:      Patellar reflexes are 2+ on the right side and 2+ on the left side. Psychiatric:        Judgment: Judgment normal.     BP 110/66 (BP Location: Right Arm, Patient Position: Sitting, Cuff Size: Small)   Pulse 63   Temp 97.6 F (36.4 C) (Oral)   Resp 16   Wt 123 lb (55.8 kg)   SpO2 100%   BMI 22.86 kg/m  Wt Readings from Last 3 Encounters:  02/24/23 123 lb (55.8 kg)  02/21/22 116 lb (52.6 kg)  06/08/21 114 lb (51.7 kg)        Assessment & Plan:  Encounter for hepatitis C screening test for low risk patient -  Hepatitis C antibody  Hyperlipidemia, unspecified hyperlipidemia type -     Comprehensive metabolic panel -     Lipid panel  Weight gain -     TSH  Preventative health care Assessment & Plan: Continue healthy diet and regular exercise.  Pap/mammo up to date. Declines shingrix today. Recommended flu/covid booster in the fall.   Orders: -     CBC with Differential/Platelet     I,Rachel Rivera,acting as a scribe for Sue Fillers, NP.,have documented all relevant documentation on the behalf of Sue Fillers, NP,as directed by  Sue Fillers, NP while in the presence of Sue Fillers, NP.   I, Sue Fillers, NP, personally preformed the services described in this documentation.  All medical record entries made by the scribe were at my direction and in my presence.  I have reviewed the chart and discharge instructions (if applicable) and agree that the record reflects my personal performance and is accurate and complete. 02/24/23   Sue Fillers, NP

## 2023-02-24 ENCOUNTER — Ambulatory Visit (INDEPENDENT_AMBULATORY_CARE_PROVIDER_SITE_OTHER): Payer: No Typology Code available for payment source | Admitting: Family

## 2023-02-24 ENCOUNTER — Telehealth: Payer: Self-pay | Admitting: Family

## 2023-02-24 ENCOUNTER — Encounter: Payer: Self-pay | Admitting: Family

## 2023-02-24 VITALS — BP 110/66 | HR 63 | Temp 97.6°F | Resp 16 | Wt 123.0 lb

## 2023-02-24 DIAGNOSIS — Z0000 Encounter for general adult medical examination without abnormal findings: Secondary | ICD-10-CM | POA: Diagnosis not present

## 2023-02-24 DIAGNOSIS — R635 Abnormal weight gain: Secondary | ICD-10-CM | POA: Diagnosis not present

## 2023-02-24 DIAGNOSIS — Z1159 Encounter for screening for other viral diseases: Secondary | ICD-10-CM | POA: Diagnosis not present

## 2023-02-24 DIAGNOSIS — E785 Hyperlipidemia, unspecified: Secondary | ICD-10-CM

## 2023-02-24 LAB — CBC WITH DIFFERENTIAL/PLATELET
Basophils Absolute: 0.1 10*3/uL (ref 0.0–0.1)
Basophils Relative: 1.8 % (ref 0.0–3.0)
Eosinophils Absolute: 0.2 10*3/uL (ref 0.0–0.7)
Eosinophils Relative: 4.5 % (ref 0.0–5.0)
HCT: 41.4 % (ref 36.0–46.0)
Hemoglobin: 14.1 g/dL (ref 12.0–15.0)
Lymphocytes Relative: 36.9 % (ref 12.0–46.0)
Lymphs Abs: 1.6 10*3/uL (ref 0.7–4.0)
MCHC: 34 g/dL (ref 30.0–36.0)
MCV: 90.4 fl (ref 78.0–100.0)
Monocytes Absolute: 0.4 10*3/uL (ref 0.1–1.0)
Monocytes Relative: 8.7 % (ref 3.0–12.0)
Neutro Abs: 2.1 10*3/uL (ref 1.4–7.7)
Neutrophils Relative %: 48.1 % (ref 43.0–77.0)
Platelets: 234 10*3/uL (ref 150.0–400.0)
RBC: 4.58 Mil/uL (ref 3.87–5.11)
RDW: 12.7 % (ref 11.5–15.5)
WBC: 4.3 10*3/uL (ref 4.0–10.5)

## 2023-02-24 LAB — LIPID PANEL
Cholesterol: 217 mg/dL — ABNORMAL HIGH (ref 0–200)
HDL: 87.8 mg/dL (ref >39.00)
LDL Cholesterol: 110 mg/dL — ABNORMAL HIGH (ref 0–99)
NonHDL: 129.62
Total CHOL/HDL Ratio: 2
Triglycerides: 98 mg/dL (ref 0.0–149.0)
VLDL: 19.6 mg/dL (ref 0.0–40.0)

## 2023-02-24 LAB — COMPREHENSIVE METABOLIC PANEL
ALT: 13 U/L (ref 0–35)
AST: 19 U/L (ref 0–37)
Albumin: 4.4 g/dL (ref 3.5–5.2)
Alkaline Phosphatase: 57 U/L (ref 39–117)
BUN: 20 mg/dL (ref 6–23)
CO2: 30 mEq/L (ref 19–32)
Calcium: 9.9 mg/dL (ref 8.4–10.5)
Chloride: 103 mEq/L (ref 96–112)
Creatinine, Ser: 0.85 mg/dL (ref 0.40–1.20)
GFR: 78.06 mL/min (ref >60.00)
Glucose, Bld: 81 mg/dL (ref 70–99)
Potassium: 4.7 mEq/L (ref 3.5–5.1)
Sodium: 141 mEq/L (ref 135–145)
Total Bilirubin: 0.7 mg/dL (ref 0.2–1.2)
Total Protein: 6.7 g/dL (ref 6.0–8.3)

## 2023-02-24 LAB — TSH: TSH: 1.81 u[IU]/mL (ref 0.35–5.50)

## 2023-02-24 NOTE — Telephone Encounter (Signed)
Electronic request sent 

## 2023-02-24 NOTE — Telephone Encounter (Signed)
Please request pap from Dr. Jorene Minors office.

## 2023-02-24 NOTE — Assessment & Plan Note (Signed)
Continue healthy diet and regular exercise.  Pap/mammo up to date. Declines shingrix today. Recommended flu/covid booster in the fall.

## 2023-02-25 LAB — HEPATITIS C ANTIBODY: Hepatitis C Ab: NONREACTIVE

## 2023-04-11 LAB — EXTERNAL GENERIC LAB PROCEDURE: COLOGUARD: NEGATIVE

## 2023-04-11 LAB — COLOGUARD: COLOGUARD: NEGATIVE

## 2023-04-17 ENCOUNTER — Ambulatory Visit: Payer: No Typology Code available for payment source | Admitting: Family

## 2023-04-17 VITALS — BP 132/76 | HR 75 | Temp 98.1°F | Resp 16 | Wt 121.0 lb

## 2023-04-17 DIAGNOSIS — H8110 Benign paroxysmal vertigo, unspecified ear: Secondary | ICD-10-CM

## 2023-04-17 NOTE — Progress Notes (Unsigned)
Subjective:     Patient ID: [name], female    DOB: [birth year], 52 y.o.   MRN: [id number]  Chief Complaint  Patient presents with   Dizziness    Complains of dizziness. Was seen at urgent care 04/14/23 and was stared on meclizine 25 mg and flonase.     HPI  Discussed the use of AI scribe software for clinical note transcription with the patient, who gave verbal consent to proceed.  History of Present Illness   The patient, with no significant past medical history, presents with a chief complaint of dizziness that started on a Tuesday after a workout. The dizziness was severe enough to cause imbalance and required the patient to hold onto walls for support. The patient also reports a sensation of pressure, similar to an ear infection. The patient had previously visited an urgent care center where fluid was found in the left ear and a small cyst was suspected on the right eardrum. The patient's blood pressure was also noted to be elevated. The patient reports that the dizziness is improving but still present, especially when changing positions quickly. The patient has been taking meclizine for the dizziness but reports that it causes excessive drowsiness.          Health Maintenance Due  Topic Date Due   Zoster Vaccines- Shingrix (1 of 2) Never done   COVID-19 Vaccine (3 - Moderna risk series) 07/02/2020    Past Medical History:  Diagnosis Date   Depression    DUB (dysfunctional uterine bleeding)    Keratoconus    Medical history non-contributory    PONV (postoperative nausea and vomiting)     Past Surgical History:  Procedure Laterality Date   CESAREAN SECTION     1997 & 2000   corneal surgery  2019   DILATION AND CURETTAGE OF UTERUS     DILITATION & CURRETTAGE/HYSTROSCOPY WITH NOVASURE ABLATION N/A 08/26/2013   Procedure: DILATATION & CURETTAGE/HYSTEROSCOPY WITH NOVASURE ABLATION;  Surgeon: Lenoard Aden, MD;  Location: [facility];  Service: Gynecology;   Laterality: N/A;   LAPAROSCOPIC TUBAL LIGATION Bilateral 08/26/2013   Procedure: LAPAROSCOPIC TUBAL LIGATION;  Surgeon: Lenoard Aden, MD;  Location: [facility];  Service: Gynecology;  Laterality: Bilateral;   TUBAL LIGATION  08/2013    Family History  Problem Relation Age of Onset   Uterine cancer Mother    Throat cancer Father        cigar smoker   Cancer Father        throat and prostate   Hypertension Sister        had VP shunt placed for increased ICP   Cancer Maternal Aunt 79       breast   Breast cancer Maternal Aunt        late 80's or early 74's   CVA Maternal Aunt    CVA Maternal Grandmother        brain hemorrhage   Lung cancer Maternal Grandfather        smoker   Stomach cancer Paternal Grandmother    Liver cancer Paternal Grandfather     Social History   Socioeconomic History   Marital status: Single    Spouse name: Not on file   Number of children: Not on file   Years of education: Not on file   Highest education level: Not on file  Occupational History   Not on file  Tobacco Use   Smoking status: Never   Smokeless tobacco: Never  Substance and Sexual Activity   Alcohol use: Yes    Alcohol/week: 3.0 - 4.0 standard drinks of alcohol    Types: 3 - 4 Glasses of wine per week    Comment: occasional   Drug use: No   Sexual activity: Not Currently  Other Topics Concern   Not on file  Social History 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   Social Determinants of Health   Financial Resource Strain: Not on file  Food Insecurity: Not on file  Transportation Needs: Not on file  Physical Activity: Not on file  Stress: Not on file  Social Connections: Unknown (02/11/2022)   Received from [facility]   Social Network    Social Network: Not on file  Intimate Partner Violence: Unknown (01/03/2022)   Received from [facility]   HITS    Physically Hurt: Not on file    Insult or Talk Down To: Not on file    Threaten  Physical Harm: Not on file    Scream or Curse: Not on file    Outpatient Medications Prior to Visit  Medication Sig Dispense Refill   estradiol (ESTRACE) 1 MG tablet Take 1 mg by mouth daily.     fluticasone (FLONASE) 50 MCG/ACT nasal spray Place into both nostrils daily.     meclizine (ANTIVERT) 25 MG tablet Take 25 mg by mouth 3 (three) times daily as needed for dizziness.     progesterone (PROMETRIUM) 100 MG capsule Take 100 mg by mouth at bedtime.     No facility-administered medications prior to visit.    No Known Allergies  ROS     Objective:    Physical Exam Constitutional:      General: She is not in acute distress.    Appearance: Normal appearance. She is well-developed.  HENT:     Head: Normocephalic and atraumatic.     Right Ear: Tympanic membrane, ear canal and external ear normal.     Left Ear: Tympanic membrane, ear canal and external ear normal.  Eyes:     General: No scleral icterus. Neck:     Thyroid: No thyromegaly.  Cardiovascular:     Rate and Rhythm: Normal rate and regular rhythm.     Heart sounds: Normal heart sounds. No murmur heard. Pulmonary:     Effort: Pulmonary effort is normal. No respiratory distress.     Breath sounds: Normal breath sounds. No wheezing.  Musculoskeletal:     Cervical back: Neck supple.  Skin:    General: Skin is warm and dry.  Neurological:     General: No focal deficit present.     Mental Status: She is alert and oriented to person, place, and time.     Cranial Nerves: No cranial nerve deficit.  Psychiatric:        Mood and Affect: Mood normal.        Behavior: Behavior normal.        Thought Content: Thought content normal.        Judgment: Judgment normal.      BP 132/76 (BP Location: Right Arm, Patient Position: Sitting, Cuff Size: Small)   Pulse 75   Temp 98.1 F (36.7 C) (Oral)   Resp 16   Wt 121 lb (54.9 kg)   SpO2 100%   BMI 22.49 kg/m  Wt Readings from Last 3 Encounters:  04/17/23 121 lb (54.9  kg)  02/24/23 123 lb (55.8 kg)  02/21/22 116 lb (  52.6 kg)       Assessment & Plan:   Problem List Items Addressed This Visit       Unprioritized   Benign paroxysmal positional vertigo - Primary     Onset during exercise, with symptoms of room spinning and imbalance. Symptoms improved over the weekend but still present. Fluid noted in left ear and possible cyst on right eardrum. Blood pressure slightly elevated. -Continue Meclizine as needed, consider half dose if full dose is too sedating. -Start Claritin and Flonase to reduce fluid in inner ear. -Check in if symptoms do not continue to improve, may need referral for vestibular rehab.       I am having Marcelle Smiling. [place] "Kim" maintain her estradiol, progesterone, meclizine, and fluticasone.  No orders of the defined types were placed in this encounter.

## 2023-04-18 DIAGNOSIS — H8110 Benign paroxysmal vertigo, unspecified ear: Secondary | ICD-10-CM | POA: Insufficient documentation

## 2023-04-18 NOTE — Assessment & Plan Note (Signed)
Onset during exercise, with symptoms of room spinning and imbalance. Symptoms improved over the weekend but still present. Fluid noted in left ear and possible cyst on right eardrum. Blood pressure slightly elevated. -Continue Meclizine as needed, consider half dose if full dose is too sedating. -Start Claritin and Flonase to reduce fluid in inner ear. -Check in if symptoms do not continue to improve, may need referral for vestibular rehab.

## 2023-04-18 NOTE — Patient Instructions (Signed)
VISIT SUMMARY:  During your recent visit, we discussed your ongoing dizziness and recent elevated blood pressure.  The dizziness started after a workout and was severe enough to cause imbalance. You also reported a sensation of pressure in your ears. An earlier visit to an urgent care center revealed fluid in your left ear and a possible small cyst on your right eardrum. Your blood pressure was also noted to be higher than usual.  YOUR PLAN:  -VERTIGO: Vertigo is a sensation of feeling off balance, often described as a spinning sensation. We will continue your current medication, Meclizine, to manage this. However, if you find it too sedating, consider taking half the dose. We will also start you on Claritin and Flonase to reduce the fluid in your inner ear. If your symptoms do not continue to improve, we may need to refer you for vestibular rehab, which is a specialized form of therapy intended to alleviate both primary and secondary problems caused by vestibular disorders.  -HYPERTENSION: Hypertension, or high blood pressure, is a condition where the force of the blood against your artery walls is consistently too high. We noted that your blood pressure was slightly elevated during your recent visits. It's important to monitor your blood pressure and report if it's consistently above 140/90.  INSTRUCTIONS:  Please continue to monitor your symptoms and blood pressure at home. If your dizziness does not improve or your blood pressure remains consistently above 140/90, please contact our office. Remember to take your medications as directed, and consider reducing the dose of Meclizine if it's causing excessive drowsiness.

## 2023-06-02 IMAGING — MG MM DIGITAL DIAGNOSTIC UNILAT*R* W/ TOMO W/ CAD
4 series · 4 of 12 positions shown · non-contrast
Comparison: Previous exam(s).

CLINICAL DATA: Screening recall for a right breast asymmetry. Of
note, the patient has started taking hormone therapy in Wednesday March, 2021.

EXAM:
DIGITAL DIAGNOSTIC UNILATERAL RIGHT MAMMOGRAM WITH TOMOSYNTHESIS AND
CAD
TECHNIQUE: Right digital diagnostic mammography and breast tomosynthesis was
performed. The images were evaluated with computer-aided detection.

[R CC synth-2D]
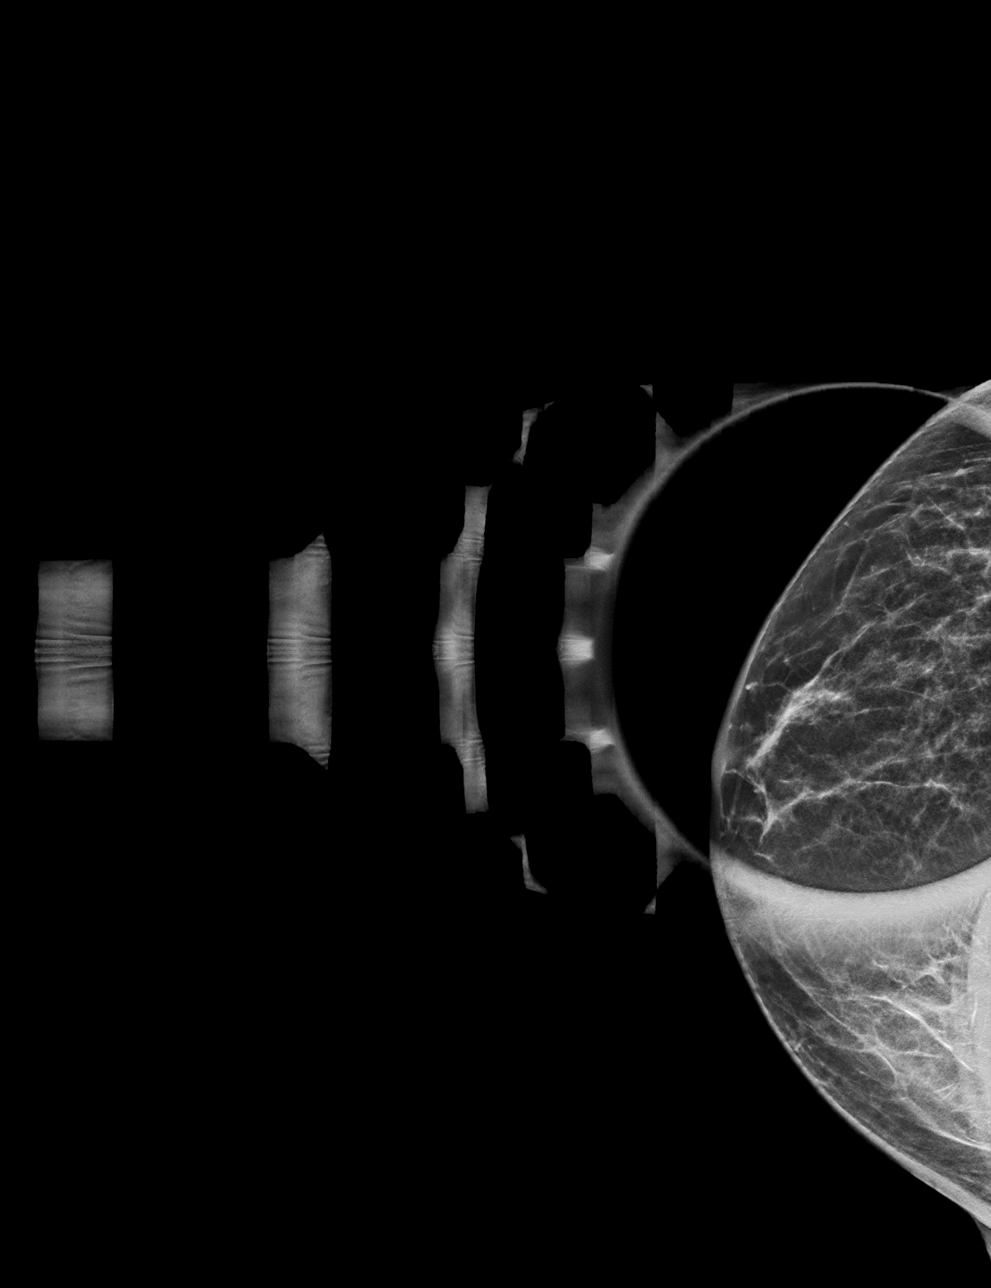

[R ML synth-2D]
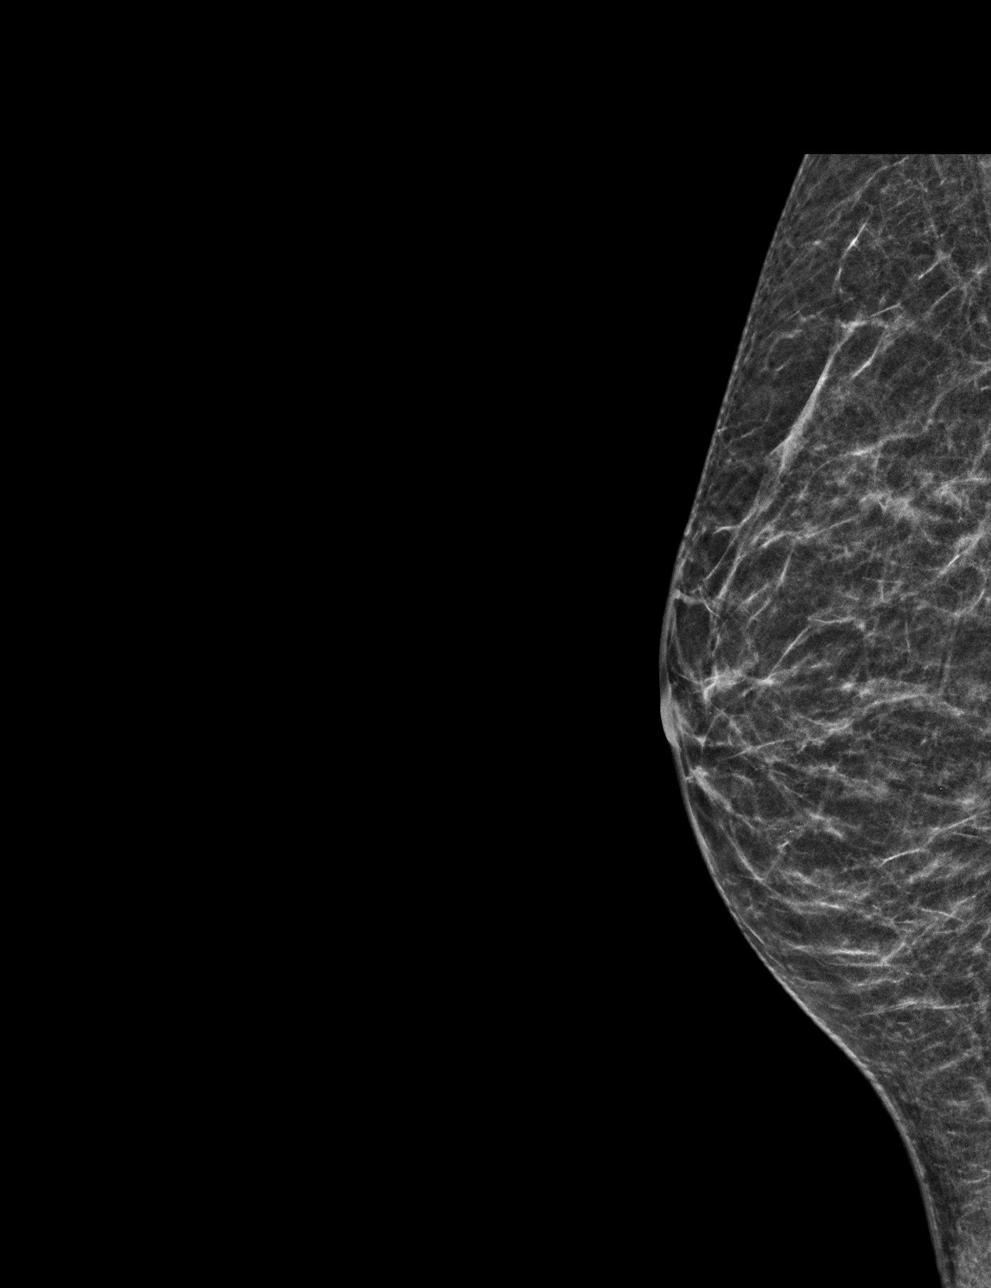

[R ML tomo · tomo slice 21/41.0]
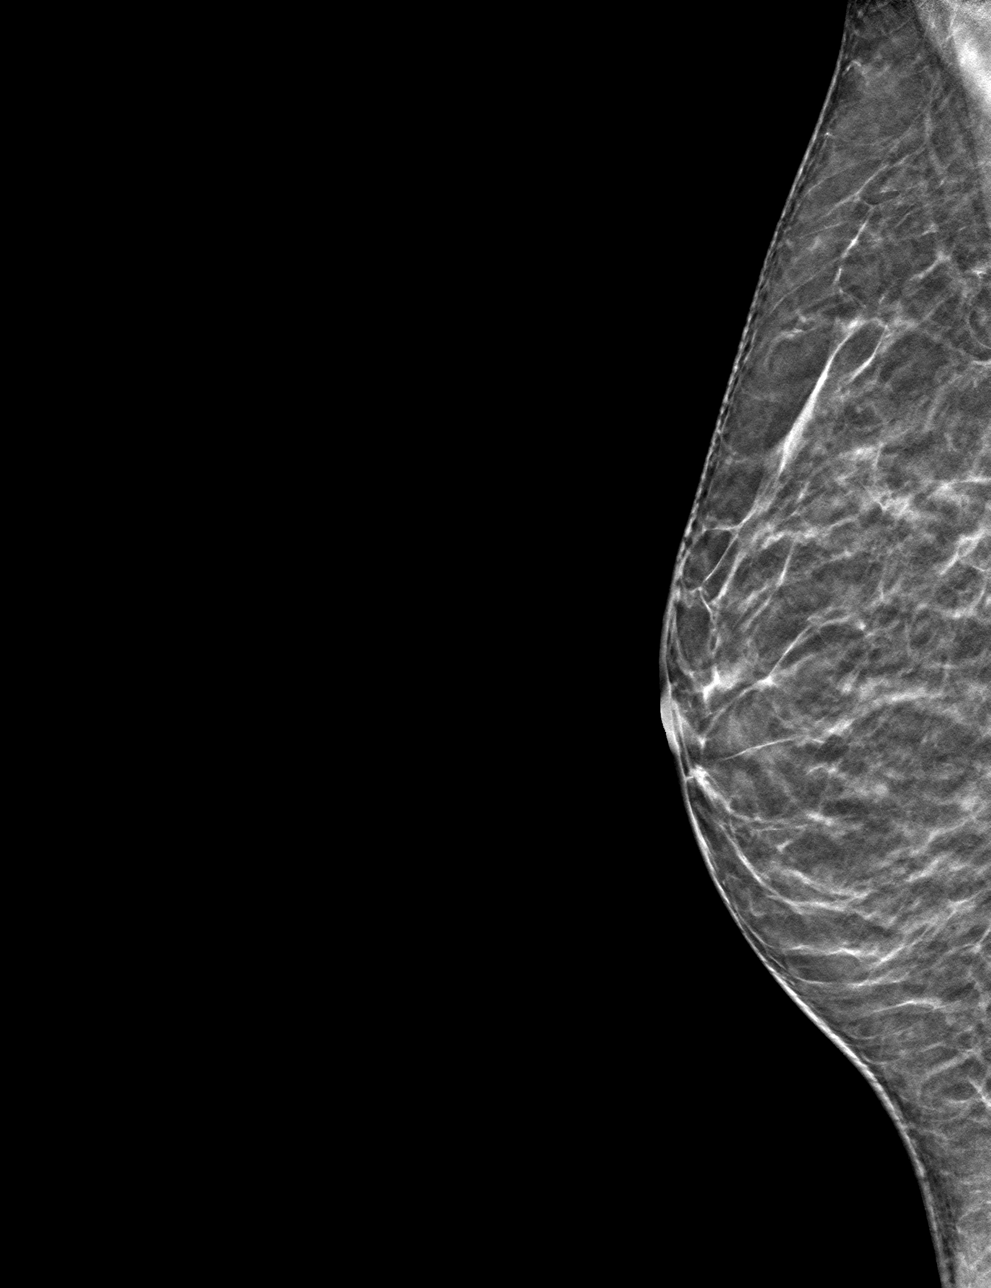

[R CC tomo · tomo slice 20/39.0]
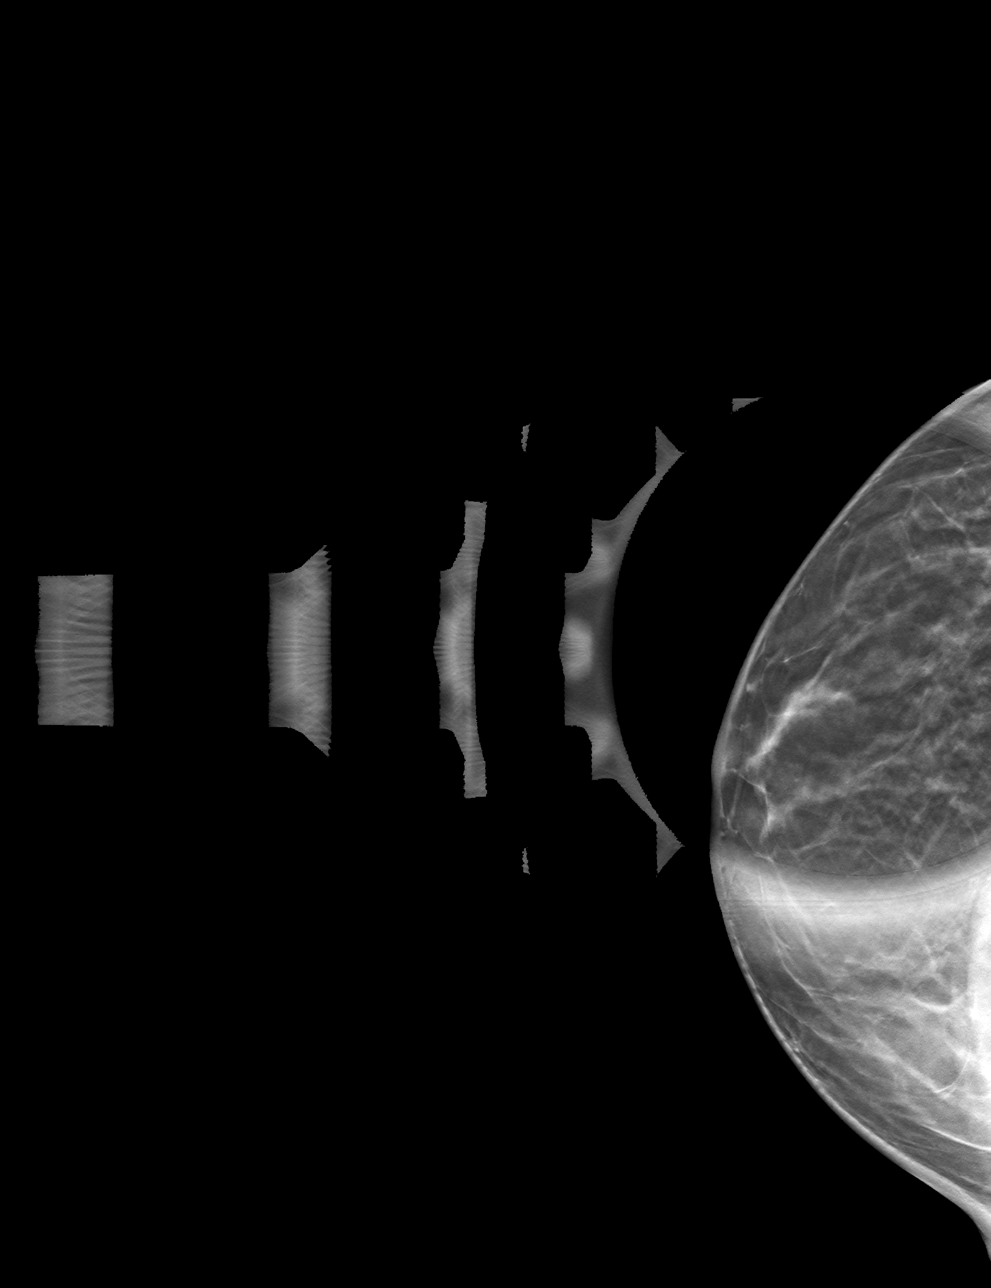

[4 of 12 positions shown; findings below may reference images not displayed]

ACR Breast Density Category b: There are scattered areas of
fibroglandular density.
FINDINGS: Spot compression tomosynthesis images through the asymmetry in the
lateral retroareolar right breast demonstrates that the tissue
appears stable compared to the patient's prior mammograms. For
example, there is no interval change from her exam in Tuesday June, 2019 or Friday November, 2017.
IMPRESSION: Resolution of the asymmetry of concern consistent with overlapping
fibroglandular tissue.

RECOMMENDATION:
Screening mammogram in one year.(Code:[id number])

I have discussed the findings and recommendations with the patient.
If applicable, a reminder letter will be sent to the patient
regarding the next appointment.

BI-RADS CATEGORY  1: Negative.

## 2023-09-03 ENCOUNTER — Other Ambulatory Visit: Payer: Self-pay | Admitting: Family

## 2023-09-03 DIAGNOSIS — Z1211 Encounter for screening for malignant neoplasm of colon: Secondary | ICD-10-CM

## 2024-02-27 ENCOUNTER — Ambulatory Visit: Payer: Self-pay | Admitting: Family

## 2024-02-27 ENCOUNTER — Encounter: Payer: Self-pay | Admitting: Family

## 2024-02-27 ENCOUNTER — Ambulatory Visit (INDEPENDENT_AMBULATORY_CARE_PROVIDER_SITE_OTHER): Payer: No Typology Code available for payment source | Admitting: Family

## 2024-02-27 VITALS — BP 121/57 | HR 65 | Temp 97.8°F | Resp 16 | Ht 61.0 in | Wt 122.0 lb

## 2024-02-27 DIAGNOSIS — E785 Hyperlipidemia, unspecified: Secondary | ICD-10-CM | POA: Diagnosis not present

## 2024-02-27 DIAGNOSIS — Z23 Encounter for immunization: Secondary | ICD-10-CM | POA: Diagnosis not present

## 2024-02-27 DIAGNOSIS — Z0000 Encounter for general adult medical examination without abnormal findings: Secondary | ICD-10-CM | POA: Diagnosis not present

## 2024-02-27 LAB — COMPREHENSIVE METABOLIC PANEL WITH GFR
ALT: 21 U/L (ref 0–35)
AST: 24 U/L (ref 0–37)
Albumin: 4.4 g/dL (ref 3.5–5.2)
Alkaline Phosphatase: 54 U/L (ref 39–117)
BUN: 13 mg/dL (ref 6–23)
CO2: 27 meq/L (ref 19–32)
Calcium: 9.3 mg/dL (ref 8.4–10.5)
Chloride: 103 meq/L (ref 96–112)
Creatinine, Ser: 0.64 mg/dL (ref 0.40–1.20)
GFR: 99.99 mL/min (ref >60.00)
Glucose, Bld: 95 mg/dL (ref 70–99)
Potassium: 4.4 meq/L (ref 3.5–5.1)
Sodium: 138 meq/L (ref 135–145)
Total Bilirubin: 0.7 mg/dL (ref 0.2–1.2)
Total Protein: 6.6 g/dL (ref 6.0–8.3)

## 2024-02-27 LAB — LIPID PANEL
Cholesterol: 234 mg/dL — ABNORMAL HIGH (ref 0–200)
HDL: 66.2 mg/dL (ref >39.00)
LDL Cholesterol: 147 mg/dL — ABNORMAL HIGH (ref 0–99)
NonHDL: 167.96
Total CHOL/HDL Ratio: 4
Triglycerides: 103 mg/dL (ref 0.0–149.0)
VLDL: 20.6 mg/dL (ref 0.0–40.0)

## 2024-02-27 NOTE — Patient Instructions (Signed)
 VISIT SUMMARY:  Today, you had your annual physical exam. We discussed your cholesterol levels, shingles prevention, and general health maintenance.  YOUR PLAN:  HYPERLIPIDEMIA: Your cholesterol levels were slightly elevated last year, but you have no acute symptoms. -We will repeat your cholesterol test. -We will also order a metabolic panel, including glucose and renal function tests.  SHINGLES PREVENTION: We discussed the benefits of the shingles vaccine, which reduces the risk of shingles by 90%. -You received the first dose of the shingles vaccine today. -Schedule the second dose in 2-6 months.  GENERAL HEALTH MAINTENANCE: Your routine health maintenance is current, and you are maintaining a healthy lifestyle. -Schedule your next mammogram at the [facility]. -Continue your regular exercise regimen. -Maintain your routine vision and dental check-ups.

## 2024-02-27 NOTE — Progress Notes (Signed)
 Subjective:     Patient ID: [name], female    DOB: Aug 19, 1971, 53 y.o.   MRN: [id number]  Chief Complaint  Patient presents with   Annual Exam    HPI  Discussed the use of AI scribe software for clinical note transcription with the patient, who gave verbal consent to proceed.  History of Present Illness  [name] "Burdette Carolin" is a 53 year old female who presents for an annual physical exam.  She experiences weight fluctuations, which she attributes to menopause. She maintains a physically active lifestyle, exercising four to five days a week, and is training for another half marathon. Her last mammogram was on May 14, and she plans to schedule her next one soon. Her Pap smear was current as of April 24. She recently updated her contact lenses following a routine vision check and had a dental checkup within the last six months. Previous lab work indicated slightly elevated cholesterol levels. She has no symptoms of cough, cold, skin concerns, hearing or vision issues, leg swelling, digestive issues, unusual muscle or joint pain, frequent headaches, or mental health concerns.  Immunizations:  shingrix  Diet: healthy Exercise: 4-5 days a week, training for a half marathon Colonoscopy:  due 03/2026 Pap Smear: 4/24 Mammogram: due, [facility] Vision: up to date Dental: up to date     Health Maintenance Due  Topic Date Due   Zoster Vaccines- Shingrix (1 of 2) Never done   COVID-19 Vaccine (3 - Moderna risk series) 07/02/2020   MAMMOGRAM  02/14/2024    Past Medical History:  Diagnosis Date   Depression    DUB (dysfunctional uterine bleeding)    Keratoconus    Medical history non-contributory    PONV (postoperative nausea and vomiting)     Past Surgical History:  Procedure Laterality Date   CESAREAN SECTION     1997 & 2000   corneal surgery  2019   DILATION AND CURETTAGE OF UTERUS     DILITATION & CURRETTAGE/HYSTROSCOPY  WITH NOVASURE ABLATION N/A 08/26/2013   Procedure: DILATATION & CURETTAGE/HYSTEROSCOPY WITH NOVASURE ABLATION;  Surgeon: Camillo Celestine, MD;  Location: [facility];  Service: Gynecology;  Laterality: N/A;   LAPAROSCOPIC TUBAL LIGATION Bilateral 08/26/2013   Procedure: LAPAROSCOPIC TUBAL LIGATION;  Surgeon: Camillo Celestine, MD;  Location: [facility];  Service: Gynecology;  Laterality: Bilateral;   TUBAL LIGATION  08/2013    Family History  Problem Relation Age of Onset   Uterine cancer Mother    Throat cancer Father        cigar smoker   Cancer Father        throat and prostate   Hypertension Sister        had VP shunt placed for increased ICP   Cancer Maternal Aunt 49       breast   Breast cancer Maternal Aunt        late 60's or early 23's   CVA Maternal Aunt    CVA Maternal Grandmother        brain hemorrhage   Lung cancer Maternal Grandfather        smoker   Stomach cancer Paternal Grandmother    Liver cancer Paternal Grandfather     Social History   Socioeconomic History   Marital status: Single    Spouse name: Not on file   Number of children: Not on file   Years of education: Not on file   Highest  education level: Not on file  Occupational History   Not on file  Tobacco Use   Smoking status: Never   Smokeless tobacco: Never  Substance and Sexual Activity   Alcohol use: Yes    Alcohol/week: 3.0 - 4.0 standard drinks of alcohol    Types: 3 - 4 Glasses of wine per week    Comment: occasional   Drug use: No   Sexual activity: Not Currently  Other Topics Concern   Not on file  Social History Narrative   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[employer]   2 grown sons 1997 and 2000   Social Drivers of Corporate investment banker Strain: Not on file  Food Insecurity: Not on file  Transportation Needs: Not on file  Physical Activity: Not on file  Stress: Not on file  Social Connections: Unknown (02/11/2022)   Received from [facility]   Social Network    Social  Network: Not on file  Intimate Partner Violence: Unknown (01/03/2022)   Received from [facility]   HITS    Physically Hurt: Not on file    Insult or Talk Down To: Not on file    Threaten Physical Harm: Not on file    Scream or Curse: Not on file    Outpatient Medications Prior to Visit  Medication Sig Dispense Refill   estradiol (ESTRACE) 1 MG tablet Take 1 mg by mouth daily.     progesterone (PROMETRIUM) 100 MG capsule Take 100 mg by mouth at bedtime.     fluticasone  (FLONASE ) 50 MCG/ACT nasal spray Place into both nostrils daily.     meclizine (ANTIVERT) 25 MG tablet Take 25 mg by mouth 3 (three) times daily as needed for dizziness.     No facility-administered medications prior to visit.    No Known Allergies  Review of Systems  Constitutional:  Negative for weight loss.  HENT:  Negative for congestion and hearing loss.   Eyes:  Negative for blurred vision.  Respiratory:  Negative for cough.   Cardiovascular:  Negative for leg swelling.  Gastrointestinal:  Negative for constipation and diarrhea.  Genitourinary:  Negative for dysuria and frequency.  Musculoskeletal:  Negative for joint pain and myalgias.  Skin:  Negative for rash.  Neurological:  Negative for headaches.  Psychiatric/Behavioral:         Denies depression/anxiety       Objective:     Physical Exam   BP (!) 121/57 (BP Location: Right Arm, Patient Position: Sitting, Cuff Size: Normal)   Pulse 65   Temp 97.8 F (36.6 C) (Oral)   Resp 16   Ht 5\' 1"  (1.549 m)   Wt 122 lb (55.3 kg)   SpO2 99%   BMI 23.05 kg/m  Wt Readings from Last 3 Encounters:  02/27/24 122 lb (55.3 kg)  04/17/23 121 lb (54.9 kg)  02/24/23 123 lb (55.8 kg)   Physical Exam  Constitutional: She is oriented to person, place, and time. She appears well-developed and well-nourished. No distress.  HENT:  Head: Normocephalic and atraumatic.  Right Ear: Tympanic membrane and ear canal normal.  Left Ear: Tympanic  membrane and ear canal normal.  Mouth/Throat: Oropharynx is clear and moist.  Eyes: Pupils are equal, round, and reactive to light. No scleral icterus.  Neck: Normal range of motion. No thyromegaly present.  Cardiovascular: Normal rate and regular rhythm.   No murmur heard. Pulmonary/Chest: Effort normal and breath sounds normal. No respiratory distress. He has no wheezes. She  has no rales. She exhibits no tenderness.  Abdominal: Soft. Bowel sounds are normal. She exhibits no distension and no mass. There is no tenderness. There is no rebound and no guarding.  Musculoskeletal: She exhibits no edema.  Lymphadenopathy:    She has no cervical adenopathy.  Neurological: She is alert and oriented to person, place, and time. She has normal patellar reflexes. She exhibits normal muscle tone. Coordination normal.  Skin: Skin is warm and dry.  Psychiatric: She has a normal mood and affect. Her behavior is normal. Judgment and thought content normal.  Breast/Pelvic: deferred           Assessment & Plan:       Assessment & Plan:   Problem List Items Addressed This Visit       Unprioritized   Preventative health care   Routine health maintenance current. Exercises regularly. Vision and dental check-ups up to date. Pap smear completed in April. - Schedule mammogram at [facility]. - Continue regular exercise regimen. - Maintain routine vision and dental check-ups. - Shingrix # 1 today      Other Visit Diagnoses       Hyperlipidemia, unspecified hyperlipidemia type    -  Primary   Relevant Orders   Comp Met (CMET)   Lipid panel       I have discontinued Sharilyn D. Templin "Kim"'s meclizine and fluticasone . I am also having her maintain her estradiol and progesterone.  No orders of the defined types were placed in this encounter.

## 2024-02-27 NOTE — Assessment & Plan Note (Addendum)
 Routine health maintenance current. Exercises regularly. Vision and dental check-ups up to date. Pap smear completed in April. - Schedule mammogram at [facility]. - Continue regular exercise regimen. - Maintain routine vision and dental check-ups. - Shingrix # 1 today

## 2024-05-29 ENCOUNTER — Ambulatory Visit (INDEPENDENT_AMBULATORY_CARE_PROVIDER_SITE_OTHER)

## 2024-05-29 DIAGNOSIS — Z23 Encounter for immunization: Secondary | ICD-10-CM | POA: Diagnosis not present

## 2024-05-29 NOTE — Progress Notes (Signed)
 Patient here today for second shingles vaccine. Patient tolerated well.

## 2024-06-26 ENCOUNTER — Ambulatory Visit: Admitting: Family

## 2024-06-26 VITALS — BP 122/55 | HR 84 | Temp 98.7°F | Resp 16 | Ht 61.0 in | Wt 128.4 lb

## 2024-06-26 DIAGNOSIS — Z23 Encounter for immunization: Secondary | ICD-10-CM | POA: Diagnosis not present

## 2024-06-26 DIAGNOSIS — F32A Depression, unspecified: Secondary | ICD-10-CM

## 2024-06-26 MED ORDER — BUPROPION HCL ER (XL) 150 MG PO TB24: 150.0000 mg | ORAL_TABLET | Freq: Every day | ORAL | 0 refills | Status: DC

## 2024-06-26 NOTE — Assessment & Plan Note (Signed)
 Uncontrolled. Has been on citalopram  in the past which caused weight gain and Effexor  which worked well but was very difficult for her to come off of.  Will give trial of Wellbutrin  xl 150mg  once daily. She plans to start working with a therapist in a few weeks.

## 2024-06-26 NOTE — Progress Notes (Unsigned)
 -    Subjective:     Patient ID: [name], female    DOB: Oct 03, 1971, 53 y.o.   MRN: [id number]  Chief Complaint  Patient presents with   Depression    Patient complains of depression symptoms     Depression         Discussed the use of AI scribe software for clinical note transcription with the patient, who gave verbal consent to proceed.  History of Present Illness   Patient is a 53 yr old female who presents today to discuss depression. Notes that she lost her dog a few months back.  She is now alone for the first time in her home which is an adjustment. 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 It is open enrollment currently and she is very busy.  She is having trouble focusing at work.  I feel like I have ADD.      Health Maintenance Due  Topic Date Due   Hepatitis B Vaccines 19-59 Average Risk (1 of 3 - 19+ 3-dose series) Never done   Pneumococcal Vaccine: 50+ Years (1 of 1 - PCV) Never done   COVID-19 Vaccine (3 - Moderna risk series) 07/02/2020   Mammogram  02/14/2024   Influenza Vaccine  05/03/2024    Past Medical History:  Diagnosis Date   Depression    DUB (dysfunctional uterine bleeding)    Keratoconus    Medical history non-contributory    PONV (postoperative nausea and vomiting)     Past Surgical History:  Procedure Laterality Date   CESAREAN SECTION     1997 & 2000   corneal surgery  2019   DILATION AND CURETTAGE OF UTERUS     DILITATION & CURRETTAGE/HYSTROSCOPY WITH NOVASURE ABLATION N/A 08/26/2013   Procedure: DILATATION & CURETTAGE/HYSTEROSCOPY WITH NOVASURE ABLATION;  Surgeon: Charlie JINNY Flowers, MD;  Location: [facility];  Service: Gynecology;  Laterality: N/A;   LAPAROSCOPIC TUBAL LIGATION Bilateral 08/26/2013   Procedure: LAPAROSCOPIC TUBAL LIGATION;  Surgeon: Charlie JINNY Flowers, MD;  Location: [facility];  Service: Gynecology;  Laterality: Bilateral;   TUBAL  LIGATION  08/2013    Family History  Problem Relation Age of Onset   Uterine cancer Mother    Throat cancer Father        cigar smoker   Cancer Father        throat and prostate   Hypertension Sister        had VP shunt placed for increased ICP   Cancer Maternal Aunt 80       breast   Breast cancer Maternal Aunt        late 31's or early 37's   CVA Maternal Aunt    CVA Maternal Grandmother        brain hemorrhage   Lung cancer Maternal Grandfather        smoker   Stomach cancer Paternal Grandmother    Liver cancer Paternal Grandfather     Social History   Socioeconomic History   Marital status: Single    Spouse name: Not on file   Number of children: Not on file   Years of education: Not on file   Highest education level: Not on file  Occupational History   Not on file  Tobacco Use   Smoking status: Never   Smokeless tobacco: Never  Substance and Sexual Activity   Alcohol use: Yes  Alcohol/week: 3.0 - 4.0 standard drinks of alcohol    Types: 3 - 4 Glasses of wine per week    Comment: occasional   Drug use: No   Sexual activity: Not Currently  Other Topics Concern   Not on file  Social History Narrative   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   Social Drivers of Corporate investment banker Strain: Not on file  Food Insecurity: Not on file  Transportation Needs: Not on file  Physical Activity: Not on file  Stress: Not on file  Social Connections: Unknown (02/11/2022)   Received [facility]   Social Network    Social Network: Not on file  Intimate Partner Violence: Unknown (01/03/2022)   Received from [facility]   HITS    Physically Hurt: Not on file    Insult or Talk Down To: Not on file    Threaten Physical Harm: Not on file    Scream or Curse: Not on file    Outpatient Medications Prior to Visit  Medication Sig Dispense Refill   estradiol (ESTRACE) 1 MG tablet Take 1 mg by mouth daily.     progesterone (PROMETRIUM)  100 MG capsule Take 100 mg by mouth at bedtime.     No facility-administered medications prior to visit.    No Known Allergies  Review of Systems  Psychiatric/Behavioral:  Positive for depression.    See HPI    Objective:    Physical Exam Constitutional:      Appearance: Normal appearance.  Cardiovascular:     Rate and Rhythm: Normal rate and regular rhythm.  Pulmonary:     Effort: Pulmonary effort is normal.  Neurological:     Mental Status: She is alert and oriented to person, place, and time.  Psychiatric:        Attention and Perception: Attention normal.        Mood and Affect: Mood normal. Affect is tearful.        Speech: Speech normal.        Behavior: Behavior normal.        Thought Content: Thought content normal.        Cognition and Memory: Cognition and memory normal.        Judgment: Judgment normal.      BP (!) 122/55   Pulse 84   Temp 98.7 F (37.1 C) (Oral)   Resp 16   Ht 5' 1 (1.549 m)   Wt 128 lb 6.4 oz (58.2 kg)   SpO2 100%   BMI 24.26 kg/m  Wt Readings from Last 3 Encounters:  06/26/24 128 lb 6.4 oz (58.2 kg)  02/27/24 122 lb (55.3 kg)  04/17/23 121 lb (54.9 kg)       Assessment & Plan:   Problem List Items Addressed This Visit       Unprioritized   Depression   Uncontrolled. Has been on citalopram  in the past which caused weight gain and Effexor  which worked well but was very difficult for her to come off of.  Will give trial of Wellbutrin  xl 150mg  once daily. She plans to start working with a therapist in a few weeks.       Relevant Medications   buPROPion  (WELLBUTRIN  XL) 150 MG 24 hr tablet   Other Visit Diagnoses       Needs flu shot    -  Primary   Relevant Orders   Flu vaccine trivalent PF, 6mos and older(Flulaval,Afluria,Fluarix,Fluzone)  I am having Suzen BIRCH. Nagorski Kim start on buPROPion . I am also having her maintain her estradiol and progesterone.  Meds ordered this encounter  Medications    buPROPion  (WELLBUTRIN  XL) 150 MG 24 hr tablet    Sig: Take 1 tablet (150 mg total) by mouth daily.    Dispense:  90 tablet    Refill:  0    Supervising Provider:   DOMENICA BLACKBIRD A [[id number]]

## 2024-06-26 NOTE — Patient Instructions (Signed)
 Please keep your upcoming appointment with the therapist. Please start Wellbutrin  XL once daily in the AM.

## 2024-07-12 ENCOUNTER — Other Ambulatory Visit: Payer: Self-pay | Admitting: Obstetrics & Gynecology

## 2024-07-12 DIAGNOSIS — Z1231 Encounter for screening mammogram for malignant neoplasm of breast: Secondary | ICD-10-CM

## 2024-07-24 ENCOUNTER — Ambulatory Visit
Admission: RE | Admit: 2024-07-24 | Discharge: 2024-07-24 | Disposition: A | Discharge status: Home / Self Care | Source: Ambulatory Visit | Attending: Obstetrics & Gynecology | Admitting: Obstetrics & Gynecology

## 2024-07-24 DIAGNOSIS — Z1231 Encounter for screening mammogram for malignant neoplasm of breast: Secondary | ICD-10-CM

## 2024-09-06 LAB — BASIC METABOLIC PANEL WITH GFR: Sodium: 136 — AB (ref 137–147)

## 2024-09-10 ENCOUNTER — Emergency Department (HOSPITAL_BASED_OUTPATIENT_CLINIC_OR_DEPARTMENT_OTHER)

## 2024-09-10 ENCOUNTER — Ambulatory Visit: Admitting: Family

## 2024-09-10 ENCOUNTER — Other Ambulatory Visit: Payer: Self-pay

## 2024-09-10 ENCOUNTER — Emergency Department (HOSPITAL_BASED_OUTPATIENT_CLINIC_OR_DEPARTMENT_OTHER)
Admission: EM | Admit: 2024-09-10 | Discharge: 2024-09-10 | Disposition: A | Attending: Emergency Medicine | Admitting: Emergency Medicine

## 2024-09-10 VITALS — BP 133/72 | HR 90 | Temp 98.8°F | Resp 16 | Ht 61.0 in | Wt 123.0 lb

## 2024-09-10 DIAGNOSIS — R10A1 Flank pain, right side: Secondary | ICD-10-CM | POA: Insufficient documentation

## 2024-09-10 DIAGNOSIS — R109 Unspecified abdominal pain: Secondary | ICD-10-CM

## 2024-09-10 MED ORDER — TRAMADOL HCL 50 MG PO TABS: 50.0000 mg | ORAL_TABLET | Freq: Three times a day (TID) | ORAL | 0 refills | Status: AC | PRN

## 2024-09-10 MED ORDER — ACETAMINOPHEN 325 MG PO TABS
650.0000 mg | ORAL_TABLET | Freq: Once | ORAL | Status: AC
  Administered 2024-09-10: 650 mg via ORAL
  Filled 2024-09-10: qty 2

## 2024-09-10 NOTE — Assessment & Plan Note (Signed)
 Right flank and abdominal pain status post cholecystectomy with nephrolithiasis Persistent severe right flank and abdominal pain post-cholecystectomy. Differential includes surgical nerve damage and nephrolithiasis. CT showed stable non-obstructive bilateral nephrolithiasis, no stones in ureter or bladder. Microscopic hematuria noted in ED. Gabapentin ineffective for pain relief- just causes her to fall asleep. CT also showed status post cholecystectomy. Trace fluid with adjacent mild soft tissue stranding at the gallbladder fossa. No focal fluid collections. No dilated bile ducts  - Prescribed tramadol  for severe pain, to be used as needed when not driving or working. - Ordered urine culture to rule out infection. - Ordered repeat blood count to check for new leukocytosis or LFT elevation which could suggest retained bile duct stones or peritoneal infection. - Ordered abdominal x-ray to assess for kidney stones in the ureter- they were noted bilaterally in the kidneys (non-obstructive) on 09/06/24 per CT. - Advised follow-up with urologist for further evaluation of nephrolithiasis.

## 2024-09-10 NOTE — Progress Notes (Signed)
 Subjective:     Patient ID: [name], female    DOB: 11-13-1970, 53 y.o.   MRN: [id number]  Chief Complaint  Patient presents with   Flank Pain    Patient here for right flank pain     HPI  Discussed the use of AI scribe software for clinical note transcription with the patient, who gave verbal consent to proceed.  History of Present Illness [name] is a 53 year old female who presents with persistent abdominal pain following a recent cholecystectomy.  She underwent a cholecystectomy on November 14th after a severe gallbladder attack on November 13th, which required ambulance transport. Since the surgery, she has been experiencing persistent and severe abdominal pain that radiates across her abdomen and towards her back. The pain is intense, making it difficult for her to sleep on her side and requiring her to roll out of bed due to its severity.  On December 5th, she returned to the ER due to the severity of the pain, which she described as excruciating and sensitive to touch, to the point where clothing contact is unbearable. The pain is constant and does not resemble her previous experience with kidney stones, which she describes as 'almost like the pain of the gallbladder attack, but not the severity.'  A CT scan on December 5th was performed, and she was told she has kidney stones in both kidneys, but not in the ureter or bladder. Despite this, she continues to experience significant pain, which she describes as 'sharp' and 'catching' with movement.  In the ER, she was given gabapentin, which helps her sleep but does not alleviate the pain. She has been taking ibuprofen and Tylenol  regularly for the past four weeks. She was also prescribed Flomax and a higher dose of ibuprofen by her surgeon's office. Despite these medications, the pain persists, impacting her ability to work and sleep comfortably.  There was microscopic blood in her urine at the ER, which  can be associated with kidney stones, although she does not feel the current pain is similar to her past kidney stone experience. She has not had a colonoscopy but has completed two Cologuard tests. She saw her surgeon earlier today who did not feel that her symptoms were related to her recent surgery but felt that her symptoms were due to her kidney stones and has referred her to urology and placed her on flomax. She is awaiting further evaluation by a urologist.       Health Maintenance Due  Topic Date Due   Hepatitis B Vaccines 19-59 Average Risk (1 of 3 - 19+ 3-dose series) Never done   Pneumococcal Vaccine: 50+ Years (1 of 1 - PCV) Never done   COVID-19 Vaccine (3 - Moderna risk series) 07/02/2020   Zoster Vaccines- Shingrix  (2 of 2) 07/24/2024    Past Medical History:  Diagnosis Date   Depression    DUB (dysfunctional uterine bleeding)    Keratoconus    Medical history non-contributory    PONV (postoperative nausea and vomiting)     Past Surgical History:  Procedure Laterality Date   CESAREAN SECTION     1997 & 2000   corneal surgery  2019   DILATION AND CURETTAGE OF UTERUS     DILITATION & CURRETTAGE/HYSTROSCOPY WITH NOVASURE ABLATION N/A 08/26/2013   Procedure: DILATATION & CURETTAGE/HYSTEROSCOPY WITH NOVASURE ABLATION;  Surgeon: Charlie JINNY Flowers, MD;  Location: [facility];  Service: Gynecology;  Laterality: N/A;   LAPAROSCOPIC TUBAL LIGATION  Bilateral 08/26/2013   Procedure: LAPAROSCOPIC TUBAL LIGATION;  Surgeon: Charlie JINNY Flowers, MD;  Location: [facility];  Service: Gynecology;  Laterality: Bilateral;   TUBAL LIGATION  08/2013    Family History  Problem Relation Age of Onset   Uterine cancer Mother    Throat cancer Father        cigar smoker   Cancer Father        throat and prostate   Hypertension Sister        had VP shunt placed for increased ICP   Cancer Maternal Aunt 54       breast   Breast cancer Maternal Aunt        late 58's or early 32's   CVA Maternal  Aunt    CVA Maternal Grandmother        brain hemorrhage   Lung cancer Maternal Grandfather        smoker   Stomach cancer Paternal Grandmother    Liver cancer Paternal Grandfather     Social History   Socioeconomic History   Marital status: Single    Spouse name: Not on file   Number of children: Not on file   Years of education: Not on file   Highest education level: Not on file  Occupational History   Not on file  Tobacco Use   Smoking status: Never   Smokeless tobacco: Never  Substance and Sexual Activity   Alcohol use: Yes    Alcohol/week: 3.0 - 4.0 standard drinks of alcohol    Types: 3 - 4 Glasses of wine per week    Comment: occasional   Drug use: No   Sexual activity: Not Currently  Other Topics Concern   Not on file  Social History Narrative   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   Social Drivers of Corporate Investment Banker Strain: Not on file  Food Insecurity: Low Risk (08/16/2024)   Received from [facility]   Hunger Vital Sign    Within the past 12 months, you worried that your food would run out before you got money to buy more: Never true    Within the past 12 months, the food you bought just didn't last and you didn't have money to get more. : Never true  Transportation Needs: No Transportation Needs (08/16/2024)   Received from [facility]    In the past 12 months, has lack of reliable transportation kept you from medical appointments, meetings, work or from getting things needed for daily living? : No  Physical Activity: Not on file  Stress: Not on file  Social Connections: Unknown (02/11/2022)   Received from [facility]   Social Network    Social Network: Not on file  Intimate Partner Violence: Unknown (01/03/2022)   Received from [facility]   HITS    Physically Hurt: Not on file    Insult or Talk Down To: Not on file    Threaten Physical Harm: Not on file    Scream or Curse: Not on file     Outpatient Medications Prior to Visit  Medication Sig Dispense Refill   buPROPion  (WELLBUTRIN  XL) 150 MG 24 hr tablet Take 1 tablet (150 mg total) by mouth daily. 90 tablet 0   estradiol (ESTRACE) 1 MG tablet Take 1 mg by mouth daily.     gabapentin (NEURONTIN) 100 MG capsule Take 100 mg by mouth.     progesterone (PROMETRIUM) 100  MG capsule Take 100 mg by mouth at bedtime.     tamsulosin (FLOMAX) 0.4 MG CAPS capsule Take 0.4 mg by mouth daily.     No facility-administered medications prior to visit.    No Known Allergies  ROS See HPI    Objective:    Physical Exam Constitutional:      Appearance: She is well-developed.  Cardiovascular:     Rate and Rhythm: Normal rate and regular rhythm.     Heart sounds: Normal heart sounds. No murmur heard. Pulmonary:     Effort: Pulmonary effort is normal. No respiratory distress.     Breath sounds: Normal breath sounds. No wheezing.  Abdominal:     General: Bowel sounds are normal.     Palpations: Abdomen is soft.     Tenderness: There is abdominal tenderness in the right upper quadrant. There is guarding.     Comments: Bilateral CVAT R>L  Psychiatric:        Behavior: Behavior normal.        Thought Content: Thought content normal.        Judgment: Judgment normal.      BP 133/72 (BP Location: Right Arm, Patient Position: Sitting, Cuff Size: Small)   Pulse 90   Temp 98.8 F (37.1 C) (Oral)   Resp 16   Ht 5' 1 (1.549 m)   Wt 123 lb (55.8 kg)   SpO2 100%   BMI 23.24 kg/m  Wt Readings from Last 3 Encounters:  09/10/24 123 lb (55.8 kg)  09/10/24 123 lb (55.8 kg)  06/26/24 128 lb 6.4 oz (58.2 kg)       Assessment & Plan:   Problem List Items Addressed This Visit       Unprioritized   Right flank pain - Primary   Right flank and abdominal pain status post cholecystectomy with nephrolithiasis Persistent severe right flank and abdominal pain post-cholecystectomy. Differential includes surgical nerve damage and  nephrolithiasis. CT showed stable non-obstructive bilateral nephrolithiasis, no stones in ureter or bladder. Microscopic hematuria noted in ED. Gabapentin ineffective for pain relief- just causes her to fall asleep. CT also showed status post cholecystectomy. Trace fluid with adjacent mild soft tissue stranding at the gallbladder fossa. No focal fluid collections. No dilated bile ducts  - Prescribed tramadol  for severe pain, to be used as needed when not driving or working. - Ordered urine culture to rule out infection. - Ordered repeat blood count to check for new leukocytosis or LFT elevation which could suggest retained bile duct stones or peritoneal infection. - Ordered abdominal x-ray to assess for kidney stones in the ureter- they were noted bilaterally in the kidneys (non-obstructive) on 09/06/24 per CT. - Advised follow-up with urologist for further evaluation of nephrolithiasis.       Relevant Medications   traMADol  (ULTRAM ) 50 MG tablet   Other Relevant Orders   Urinalysis, Routine w reflex microscopic   Urine Culture   CBC w/Diff   Comp Met (CMET)   DG Abd 2 Views    Assessment & Plan    I am having Suzen BIRCH. Yogi Kim start on traMADol . I am also having her maintain her estradiol, progesterone, buPROPion , gabapentin, and tamsulosin.  Meds ordered this encounter  Medications   traMADol  (ULTRAM ) 50 MG tablet    Sig: Take 1 tablet (50 mg total) by mouth every 8 (eight) hours as needed for up to 5 days.    Dispense:  15 tablet    Refill:  0  Supervising Provider:   DOMENICA BLACKBIRD A [phone]

## 2024-09-10 NOTE — Patient Instructions (Signed)
  VISIT SUMMARY: You came in today because of persistent and severe abdominal pain following your recent gallbladder removal surgery. You have been experiencing intense pain that radiates across your abdomen and towards your back, making it difficult to sleep and affecting your daily activities. A recent CT scan showed kidney stones in both kidneys, but none in the ureter or bladder. Despite taking various medications, the pain has not improved.  YOUR PLAN: -RIGHT FLANK AND ABDOMINAL PAIN POST-CHOLECYSTECTOMY WITH NEPHROLITHIASIS: You are experiencing severe abdominal and right flank pain after your gallbladder surgery. This pain could be due to surgical nerve damage or kidney stones. A CT scan showed stable kidney stones, and there was microscopic blood in your urine. Gabapentin has not been effective for your pain. We have prescribed tramadol  for severe pain, which you should use as needed when you are not driving or working. We have also ordered a urine culture to check for infection, a repeat blood count to look for signs of infection, and an abdominal x-ray to see if there are any kidney stones in your ureter. You should follow up with a urologist for further evaluation of your kidney stones.  INSTRUCTIONS: Please take tramadol  as prescribed for severe pain, but avoid using it when you need to drive or work. Complete the urine culture, repeat blood count, and abdominal x-ray as soon as possible. Follow up with a urologist for further evaluation of your kidney stones. If your pain worsens or you experience new symptoms, please seek medical attention immediately.

## 2024-09-10 NOTE — Discharge Instructions (Signed)
 If you develop worsening, continued, or recurrent abdominal pain, uncontrolled vomiting, fever, chest or back pain, or any other new/concerning symptoms then return to the ER for evaluation.

## 2024-09-10 NOTE — ED Provider Notes (Addendum)
 [place] [facility] Provider Note   CSN: [id number] Arrival date & time: 09/10/24  1651     Patient presents with: Abdominal Pain   [name] is a 53 y.o. female.   HPI 53 year old female presents with postoperative right upper quadrant abdominal pain.  She is specifically presenting requesting an abdominal x-ray.  1 month ago she had laparoscopic cholecystectomy.  Has been dealing with right upper quadrant pain ever since.  No fevers or vomit on 12/5 and had an abdominal CT scan and was put on gabapentin for possible nerve type pain.  She has been taking ibuprofen and Tylenol  and the gabapentin.  She saw her general surgeon today who evaluated her and  feels like she has a kidney stone due to some blood in the urine.  Saw her PCP as well today who ordered some labs that have been drawn but not resulted.  She also wanted her to get a two-view abdominal x-ray.  When the patient came down to get the x-ray the x-ray office was closed and they told her she would have to go to the ER for the imaging.  The patient states that this pain is not new or worse or different.  She denies any chest pain or shortness of breath.  She states that she is trying to get an urgent follow-up with urology tomorrow.  Prior to Admission medications   Medication Sig Start Date End Date Taking? Authorizing Provider  buPROPion  (WELLBUTRIN  XL) 150 MG 24 hr tablet Take 1 tablet (150 mg total) by mouth daily. 06/26/24   O'Sullivan, Melissa, NP  estradiol (ESTRACE) 1 MG tablet Take 1 mg by mouth daily. 11/07/20   [provider]  gabapentin (NEURONTIN) 100 MG capsule Take 100 mg by mouth. 09/06/24 10/06/24  [provider]  progesterone (PROMETRIUM) 100 MG capsule Take 100 mg by mouth at bedtime. 11/07/20   [provider]  tamsulosin (FLOMAX) 0.4 MG CAPS capsule Take 0.4 mg by mouth daily. 09/10/24 10/10/24  [provider]  traMADol  (ULTRAM ) 50 MG tablet  Take 1 tablet (50 mg total) by mouth every 8 (eight) hours as needed for up to 5 days. 09/10/24 09/15/24  O'Sullivan, Melissa, NP    Allergies: Patient has no known allergies.    Review of Systems  Constitutional:  Negative for fever.  Respiratory:  Negative for shortness of breath.   Cardiovascular:  Negative for chest pain.  Gastrointestinal:  Positive for abdominal pain and nausea. Negative for vomiting.  Genitourinary:  Negative for dysuria.  Musculoskeletal:  Negative for back pain.    Updated Vital Signs BP 134/76   Pulse 81   Temp 98.1 F (36.7 C)   Resp 17   Ht 5' 1 (1.549 m)   Wt 55.8 kg   SpO2 100%   BMI 23.24 kg/m   Physical Exam Vitals and nursing note reviewed.  Constitutional:      General: She is not in acute distress.    Appearance: She is well-developed. She is not ill-appearing or diaphoretic.  HENT:     Head: Normocephalic and atraumatic.  Cardiovascular:     Rate and Rhythm: Normal rate and regular rhythm.     Heart sounds: Normal heart sounds.  Pulmonary:     Effort: Pulmonary effort is normal.  Abdominal:     Palpations: Abdomen is soft.     Tenderness: There is abdominal tenderness in the right upper quadrant.     Comments: Surgical  incisions C/D/I  Skin:    General: Skin is warm and dry.  Neurological:     Mental Status: She is alert.     (all labs ordered are listed, but only abnormal results are displayed) Labs Reviewed - No data to display  EKG: EKG Interpretation Date/Time:  Tuesday September 10 2024 17:08:15 EST Ventricular Rate:  81 PR Interval:  138 QRS Duration:  89 QT Interval:  348 QTC Calculation: 404 R Axis:   73  Text Interpretation: Sinus rhythm no acute ST/T changes No old tracing to compare Confirmed by Freddi Hamilton [phone]) on 09/10/2024 5:28:50 PM  Radiology: ARCOLA Abd 2 Views Result Date: 09/10/2024 CLINICAL DATA:  Right flank and abdominal pain, cholecystectomy 4 weeks ago, reported history of renal calculi  EXAM: ABDOMEN - 2 VIEW COMPARISON:  None Available. FINDINGS: Supine and upright frontal views of the abdomen and pelvis are obtained on 3 images. Numerous cardiac leads project over the upper abdomen. No bowel obstruction or ileus. There are no abdominal masses. Punctate 2 mm calcification in the right mid abdomen may reflect a renal calculus. No free gas in the greater peritoneal sac. Lung bases are clear. IMPRESSION: 1. Punctate 2 mm calcification right mid abdomen, consistent with renal calculus. 2. Unremarkable bowel gas pattern. Electronically Signed   By: Ozell Daring M.D.   On: 09/10/2024 18:26     Procedures   Medications Ordered in the ED  acetaminophen  (TYLENOL ) tablet 650 mg (650 mg Oral Given 09/10/24 1722)                                    Medical Decision Making Amount and/or Complexity of Data Reviewed External Data Reviewed: labs, radiology and notes.    Details: From PCP, general surgery office visits today and ED visit on 12/5 Radiology: ordered and independent interpretation performed.    Details: Possible renal calculus  Risk OTC drugs.   Patient is well-appearing with stable vitals.  She is requesting an abdominal x-ray only as this is what her provider had originally ordered.  Discussed we will not likely get her labs back tonight and she understands this as well as limitations in the x-ray itself but still wants it as this is what her provider had ordered.  She did have a CT a few days ago that did not show any obvious acute pathology.  Unclear if this is truly from a renal stone as the CT report indicated this was nephrolithiasis rather than ureterolithiasis.  Either way, she feels comfortable with discharge and following up with urology tomorrow.  I think this is reasonable, will discharge home with return precautions.     Final diagnoses:  Right sided abdominal pain    ED Discharge Orders     None          Freddi Hamilton, MD 09/10/24 2108     Freddi Hamilton, MD 09/10/24 2109

## 2024-09-10 NOTE — ED Triage Notes (Signed)
 Pt reports cholecystectomy 4 weeks ago, c/o ongoing RUQ and epigastric abd pain.  Denies n/v/d/fever.   Took ibuprofen appx 1430.  LBM today.

## 2024-09-11 LAB — COMPREHENSIVE METABOLIC PANEL WITH GFR
ALT: 16 U/L (ref 0–35)
AST: 17 U/L (ref 0–37)
Albumin: 4.8 g/dL (ref 3.5–5.2)
Alkaline Phosphatase: 81 U/L (ref 39–117)
BUN: 12 mg/dL (ref 6–23)
CO2: 30 meq/L (ref 19–32)
Calcium: 9.8 mg/dL (ref 8.4–10.5)
Chloride: 103 meq/L (ref 96–112)
Creatinine, Ser: 0.78 mg/dL (ref 0.40–1.20)
GFR: 85.61 mL/min (ref >60.00)
Glucose, Bld: 59 mg/dL — ABNORMAL LOW (ref 70–99)
Potassium: 4.5 meq/L (ref 3.5–5.1)
Sodium: 139 meq/L (ref 135–145)
Total Bilirubin: 0.4 mg/dL (ref 0.2–1.2)
Total Protein: 6.9 g/dL (ref 6.0–8.3)

## 2024-09-11 LAB — CBC WITH DIFFERENTIAL/PLATELET
Basophils Absolute: 0.1 K/uL (ref 0.0–0.1)
Basophils Relative: 1.1 % (ref 0.0–3.0)
Eosinophils Absolute: 0.1 K/uL (ref 0.0–0.7)
Eosinophils Relative: 2.2 % (ref 0.0–5.0)
HCT: 41.7 % (ref 36.0–46.0)
Hemoglobin: 14.6 g/dL (ref 12.0–15.0)
Lymphocytes Relative: 23.9 % (ref 12.0–46.0)
Lymphs Abs: 1.2 K/uL (ref 0.7–4.0)
MCHC: 34.9 g/dL (ref 30.0–36.0)
MCV: 88.7 fl (ref 78.0–100.0)
Monocytes Absolute: 0.5 K/uL (ref 0.1–1.0)
Monocytes Relative: 9.9 % (ref 3.0–12.0)
Neutro Abs: 3.2 K/uL (ref 1.4–7.7)
Neutrophils Relative %: 62.9 % (ref 43.0–77.0)
Platelets: 263 K/uL (ref 150.0–400.0)
RBC: 4.7 Mil/uL (ref 3.87–5.11)
RDW: 13 % (ref 11.5–15.5)
WBC: 5.1 K/uL (ref 4.0–10.5)

## 2024-09-11 LAB — URINE CULTURE
MICRO NUMBER:: 17332283
Result:: NO GROWTH
SPECIMEN QUALITY:: ADEQUATE

## 2024-09-11 LAB — URINALYSIS, ROUTINE W REFLEX MICROSCOPIC
Bilirubin Urine: NEGATIVE
Leukocytes,Ua: NEGATIVE
Nitrite: NEGATIVE
Specific Gravity, Urine: 1.02 (ref 1.000–1.030)
Total Protein, Urine: NEGATIVE
Urine Glucose: NEGATIVE
Urobilinogen, UA: 0.2 (ref 0.0–1.0)
pH: 7 (ref 5.0–8.0)

## 2024-09-12 ENCOUNTER — Encounter: Payer: Self-pay | Admitting: Family

## 2024-09-12 ENCOUNTER — Ambulatory Visit: Payer: Self-pay | Admitting: Family

## 2024-09-12 DIAGNOSIS — R109 Unspecified abdominal pain: Secondary | ICD-10-CM

## 2024-09-13 ENCOUNTER — Encounter: Payer: Self-pay | Admitting: Gastroenterology

## 2024-09-13 MED ORDER — PANTOPRAZOLE SODIUM 40 MG PO TBEC: 40.0000 mg | DELAYED_RELEASE_TABLET | Freq: Every day | ORAL | 3 refills | Status: AC

## 2024-09-13 NOTE — Progress Notes (Signed)
 Assessment: Encounter Diagnosis  Name Primary?   Nephrolithiasis Yes     Plan:  -Punctate nonobstructing nephrolithiasis unlikely to be source of pain - Per AUA guidelines cont drinking 2 -2.5L water daily, minimize sodium intake. - Discontinue tamsulosin - Referral to GI for follow up post cholecystectomy 08/16/24. - RTC on PRN  Chief Complaint: No chief complaint on file.   History of Present Illness:  [name] is a 53 y.o. year old female who presents for nephrolithiais, right mid abdominal pain.  53 y.o. year old female for nephrolithiasis following an ER visit on 09/10/24 for right sided abdominal pain showing a punctate 2 mm calcification in the right mid abdomen. Recent Cholecystectomy on 08/16/24 with EGS, with no catheterization. Upper mid abdominal burning pain at 6/10, mild right flank pain for one day. Relief with laying flat on her back,Tramadol , with mild relief with gabapentin. Episodes of nausea come in the morning and post meals. Hx of 1 kidney stone , able to pass with definitive stone treatment. Urinary symptoms stable. Currently taking tamsulosin nightly.Hydrating with water, lemon water, w/o caffeine. Bowel habits are daily, taking fiber. Denies gross hematuria, vomiting, fever, chills, swelling in legs.  CMP 09/06/24: Stable Kidney function CBC 09/06/24: Hemodynamically stable  KUB 09/10/24: IMPRESSION: Punctate 2 mm calcification right mid abdomen, consistent with  renal calculus.  Unremarkable bowel gas pattern.   CTAP W/ 09/06/24: IMPRESSION:  Postoperative changes in right upper quadrant consistent with recent cholecystectomy.  No evidence for abnormal fluid collection, abscess or dilated bile ducts Stable bilateral nephrolithiasis  CTAP W/ 08/15/24 IMPRESSION: *  Ringlike/spherical calcifications in the region of the expected cystic duct/hepatic duct confluence. This could represent possible partly calcified gallstones. Recommend correlation  with ultrasound to further interrogate biliary pathology as a potential source of discomfort. *  Mild bladder wall thickening and surrounding inflammation. Please correlate with urinalysis. *  Mild linear hypoattenuation in the lateral aspect of the right kidney, of doubtful clinical significance as described above. *  Bilateral nephrolithiasis. No obstructive uropathy.  LAB RESULTS REVIEWED: Most recent serum creatinine level: Lab Results  Component Value Date/Time   CREATININE 0.64 09/06/2024 06:32 PM    PSA levels: No components found for: PSAG  Serum testosterone levels: No components found for: TESTR No components found for: FTT   Past Medical History:  Medical History[1]  Past Surgical History:  Surgical History[2]  Allergies:  Allergies[3]  Meds: Current Medications[4]    SH: Social History[5]  Family History: The patient's family history includes Eczema in her sister.  Review of Systems All systems reviewed and otherwise negative, except as recorded above  Physical exam There were no vitals taken for this visit.  GENERAL APPEARANCE:  Well appearing, well developed, well nourished MENTAL STATUS:  Appropriate NEUROLOGIC:  Alert and oriented x 3, CN II-XII grossly intact SKIN: Warm, dry, intact GU: deferred    Urinalysis results, if present, reviewed and analyzed. Results: No results found for this or any previous visit (from the past 24 hours).   Proper PPE worn during the visit        [1] Past Medical History: Diagnosis Date   Complication of anesthesia    Varicella   [2] Past Surgical History: Procedure Laterality Date   CHOLECYSTECTOMY N/A 08/16/2024   CHOLECYSTECTOMY ROBOTIC XI performed by Donnice Alm Camp, MD at [facility] OR   SKIN BIOPSY     Procedure: SKIN BIOPSY  [3] Allergies Allergen Reactions   Trovan Other (See Comments)  [4] Current  Outpatient Medications  Medication Sig Dispense Refill   buPROPion   (WELLBUTRIN  XL) 150 mg 24 hr tablet Take 150 mg by mouth daily.     estradioL (ESTRACE) 1 mg tablet Take 1 mg by mouth daily.     estradioL (VIVELLE-DOT) 0.1 mg/24 hr Place 1 patch on the skin 2 (two) times a week. Wednesday and Saturday     gabapentin (NEURONTIN) 100 mg capsule Take 1 capsule (100 mg total) by mouth 3 (three) times a day. 90 capsule 0   progesterone (PROMETRIUM) 100 mg cap capsule Take 100 mg by mouth at bedtime.     tamsulosin (FLOMAX) 0.4 mg cap Take 1 capsule (0.4 mg total) by mouth daily. 30 capsule 0   traMADoL  (ULTRAM ) 50 mg tablet Take 1 tablet (50 mg total) by mouth every 6 (six) hours as needed for severe pain (7-10). 16 tablet 0   valACYclovir (VALTREX) 1 gram tablet Take by mouth 2 (two) times a day. for 1 day     No current facility-administered medications for this visit.  [5] Social History Tobacco Use   Smoking status: Never   Smokeless tobacco: Never  Vaping Use   Vaping status: Never Used  Substance Use Topics   Alcohol use: Yes   Drug use: Never

## 2024-09-18 ENCOUNTER — Other Ambulatory Visit

## 2024-09-18 ENCOUNTER — Encounter: Payer: Self-pay | Admitting: Gastroenterology

## 2024-09-18 ENCOUNTER — Ambulatory Visit (INDEPENDENT_AMBULATORY_CARE_PROVIDER_SITE_OTHER): Admitting: Gastroenterology

## 2024-09-18 VITALS — BP 130/68 | HR 76 | Ht 61.0 in | Wt 121.2 lb

## 2024-09-18 DIAGNOSIS — R110 Nausea: Secondary | ICD-10-CM | POA: Diagnosis not present

## 2024-09-18 DIAGNOSIS — R630 Anorexia: Secondary | ICD-10-CM | POA: Diagnosis not present

## 2024-09-18 DIAGNOSIS — R197 Diarrhea, unspecified: Secondary | ICD-10-CM | POA: Diagnosis not present

## 2024-09-18 DIAGNOSIS — Z9049 Acquired absence of other specified parts of digestive tract: Secondary | ICD-10-CM

## 2024-09-18 DIAGNOSIS — R140 Abdominal distension (gaseous): Secondary | ICD-10-CM

## 2024-09-18 DIAGNOSIS — R634 Abnormal weight loss: Secondary | ICD-10-CM

## 2024-09-18 DIAGNOSIS — R1011 Right upper quadrant pain: Secondary | ICD-10-CM

## 2024-09-18 LAB — CBC WITH DIFFERENTIAL/PLATELET
Basophils Absolute: 0 K/uL (ref 0.0–0.1)
Basophils Relative: 1 % (ref 0.0–3.0)
Eosinophils Absolute: 0.1 K/uL (ref 0.0–0.7)
Eosinophils Relative: 1.9 % (ref 0.0–5.0)
HCT: 40.8 % (ref 36.0–46.0)
Hemoglobin: 14.3 g/dL (ref 12.0–15.0)
Lymphocytes Relative: 21.2 % (ref 12.0–46.0)
Lymphs Abs: 0.9 K/uL (ref 0.7–4.0)
MCHC: 35.2 g/dL (ref 30.0–36.0)
MCV: 88.4 fl (ref 78.0–100.0)
Monocytes Absolute: 0.3 K/uL (ref 0.1–1.0)
Monocytes Relative: 6.7 % (ref 3.0–12.0)
Neutro Abs: 3.1 K/uL (ref 1.4–7.7)
Neutrophils Relative %: 69.2 % (ref 43.0–77.0)
Platelets: 202 K/uL (ref 150.0–400.0)
RBC: 4.61 Mil/uL (ref 3.87–5.11)
RDW: 12.9 % (ref 11.5–15.5)
WBC: 4.5 K/uL (ref 4.0–10.5)

## 2024-09-18 LAB — HEPATIC FUNCTION PANEL
ALT: 11 U/L (ref 3–35)
AST: 16 U/L (ref 5–37)
Albumin: 4.8 g/dL (ref 3.5–5.2)
Alkaline Phosphatase: 71 U/L (ref 39–117)
Bilirubin, Direct: 0.1 mg/dL (ref 0.1–0.3)
Total Bilirubin: 0.5 mg/dL (ref 0.2–1.2)
Total Protein: 7.5 g/dL (ref 6.0–8.3)

## 2024-09-18 LAB — BASIC METABOLIC PANEL WITH GFR
BUN: 15 mg/dL (ref 6–23)
CO2: 28 meq/L (ref 19–32)
Calcium: 9.4 mg/dL (ref 8.4–10.5)
Chloride: 103 meq/L (ref 96–112)
Creatinine, Ser: 0.71 mg/dL (ref 0.40–1.20)
GFR: 95.82 mL/min (ref >60.00)
Glucose, Bld: 96 mg/dL (ref 70–99)
Potassium: 4.5 meq/L (ref 3.5–5.1)
Sodium: 138 meq/L (ref 135–145)

## 2024-09-18 LAB — LIPASE: Lipase: 39 U/L (ref 11.0–59.0)

## 2024-09-18 LAB — C-REACTIVE PROTEIN: CRP: <0.5 mg/dL — ABNORMAL LOW (ref 1.0–20.0)

## 2024-09-18 LAB — TSH: TSH: 1.66 u[IU]/mL (ref 0.35–5.50)

## 2024-09-18 NOTE — Patient Instructions (Addendum)
 Gas/bloat Recommend low fodmap diet Ibgard 2 capsules with meals OTC  Diarrhea Continue fiber supplement Avoid food triggers  Abdominal pain Continue pantoprazole  40 mg po daily Recommend GERD diet  Your provider has requested that you go to the basement level for lab work before leaving today. Press B on the elevator. The lab is located at the first door on the left as you exit the elevator.  You have been scheduled for an MRI at [facility] on 09/22/24. Your appointment time is 11:00am. Please arrive to admitting (at main entrance of the hospital) 30 minutes prior to your appointment time for registration purposes. Please make certain not to have anything to eat or drink 4 hours prior to your test. In addition, if you have any metal in your body, have a pacemaker or defibrillator, please be sure to let your ordering physician know. This test typically takes 45 minutes to 1 hour to complete. Should you need to reschedule, please call [phone] to do so.   _______________________________________________________  If your blood pressure at your visit was 140/90 or greater, please contact your primary care physician to follow up on this.  _______________________________________________________  If you are age 45 or older, your body mass index should be between 23-30. Your Body mass index is 22.91 kg/m. If this is out of the aforementioned range listed, please consider follow up with your Primary Care Provider.  If you are age 91 or younger, your body mass index should be between 19-25. Your Body mass index is 22.91 kg/m. If this is out of the aformentioned range listed, please consider follow up with your Primary Care Provider.   ________________________________________________________  The [facility] providers would like to encourage you to use MYCHART to communicate with providers for non-urgent requests or questions.  Due to long hold times on the telephone, sending your  provider a message by [facility] may be a faster and more efficient way to get a response.  Please allow 48 business hours for a response.  Please remember that this is for non-urgent requests.  _______________________________________________________  [facility] is using a team-based approach to care.  Your team is made up of your doctor and two to three APPS. Our APPS (Nurse Practitioners and Physician Assistants) work with your physician to ensure care continuity for you. They are fully qualified to address your health concerns and develop a treatment plan. They communicate directly with your gastroenterologist to care for you. Seeing the Advanced Practice Practitioners on your physician's team can help you by facilitating care more promptly, often allowing for earlier appointments, access to diagnostic testing, procedures, and other specialty referrals.   Thank you for trusting me with your gastrointestinal care. Deanna May, FNP-C

## 2024-09-18 NOTE — Progress Notes (Signed)
 Chief Complaint:post cholecystectomy complications Primary GI Doctor: Dr. Charlanne  HPI:  Patient is a  53  year old female patient with past medical history of depression, post cholecystectomy,  who was self referred to me for a evaluation of post cholecystectomy complications .    09/06/24 seen in ED for right sided abdominal pain. She notes mild nausea without vomiting but no fevers or chills. Pain worsens with movement of the abdomen. No problems with stools or urinary symptoms. CTAP with no evidence of abnormal fluid collection, abscess or dilated bile ducts. Stable bilateral nephrolithiasis.  Interval History Patient presents for evaluation of RUQ and diarrhea post cholecystectomy.  She started to experience indigestion in October and cut out wine and other items that Ezreal Turay cause reflux. She reports she ended up in ED November 13th with several days of upper abdominal/epigastric pain, worse after eating, and nausea.  Admitted to Hospital  with Surgery consultation for cholelithiasis without acute cholecystitis.  Patient had lap chole the next day. She reports since her surgery she has been experiencing abdominal pain in RUQ that radiates across her abdomen described as a severe burning and bloating. She reports the pain radiates to her right shoulder and back. Her appetite has decreased and she reports losing 6-7 lbs since surgery. Denies GERD or dysphagia. She has been using tramadol  which has helped.  She was started on pantoprazole  5 days ago, she reports she has noticed about 40% improvement.  She has nausea without vomiting. Patient reports she is very active person and currently only able to tolerate walking.  Patient reports having two follow-up visits with surgery.  She also complains of diarrhea that started about 5 months ago. No known triggers. No exposure. No travel.  She has one loose stool per day. Taking gasx, probiotic, and fiber to help with stool not to be runny. She has  started to take the fiber at night and have BM next morning. No urgency or incontinence. She cut out caffeine and dairy to see if it would help.  No blood in stool.   No fever/chills. Not eating greasy/spicy foods.   She has done two cologuard: 03/2023, 08/2020 - both negative. Never had EGD/colonoscopy.  Nonsmoker. No alcohol use currently.  She was using NSAID's after surgery, but has been weaning off.   Surgical history: post cholecystectomy 08/16/24.   Patient's family history includes: father with tongue CA (cigar smoker)  Seen by urology for kidney stones and they told her unlikely the source of her pain.   Wt Readings from Last 3 Encounters:  09/18/24 121 lb 4 oz (55 kg)  09/10/24 123 lb (55.8 kg)  09/10/24 123 lb (55.8 kg)   Past Medical History:  Diagnosis Date   Anxiety    Depression    DUB (dysfunctional uterine bleeding)    Elevated cholesterol    Gallstones    Keratoconus    Medical history non-contributory    PONV (postoperative nausea and vomiting)     Past Surgical History:  Procedure Laterality Date   CESAREAN SECTION     1997 & 2000   corneal surgery  2019   DILATION AND CURETTAGE OF UTERUS     DILITATION & CURRETTAGE/HYSTROSCOPY WITH NOVASURE ABLATION N/A 08/26/2013   Procedure: DILATATION & CURETTAGE/HYSTEROSCOPY WITH NOVASURE ABLATION;  Surgeon: Charlie JINNY Flowers, MD;  Location: [facility];  Service: Gynecology;  Laterality: N/A;   gallbladder removal  08/16/2024   LAPAROSCOPIC TUBAL LIGATION Bilateral 08/26/2013   Procedure: LAPAROSCOPIC TUBAL LIGATION;  Surgeon: Charlie JINNY Flowers, MD;  Location: [facility];  Service: Gynecology;  Laterality: Bilateral;   TUBAL LIGATION  08/2013    Current Outpatient Medications  Medication Sig Dispense Refill   buPROPion  (WELLBUTRIN  XL) 150 MG 24 hr tablet Take 1 tablet (150 mg total) by mouth daily. 90 tablet 0   estradiol (ESTRACE) 1 MG tablet Take 1 mg by mouth daily.     pantoprazole  (PROTONIX ) 40 MG tablet Take 1  tablet (40 mg total) by mouth daily. 30 tablet 3   progesterone (PROMETRIUM) 100 MG capsule Take 100 mg by mouth at bedtime.     traMADol  (ULTRAM ) 50 MG tablet Take 50 mg by mouth.     gabapentin (NEURONTIN) 100 MG capsule Take 100 mg by mouth. (Patient not taking: Reported on 09/18/2024)     tamsulosin (FLOMAX) 0.4 MG CAPS capsule Take 0.4 mg by mouth daily. (Patient not taking: Reported on 09/18/2024)     No current facility-administered medications for this visit.    Allergies as of 09/18/2024   (No Known Allergies)    Family History  Problem Relation Age of Onset   Uterine cancer Mother    Throat cancer Father        cigar smoker   Cancer Father        throat and prostate   Hypertension Sister        had VP shunt placed for increased ICP   Cancer Maternal Aunt 4       breast   Breast cancer Maternal Aunt        late 60's or early 27's   CVA Maternal Aunt    CVA Maternal Grandmother        brain hemorrhage   Lung cancer Maternal Grandfather        smoker   Stomach cancer Paternal Grandmother    Liver cancer Paternal Grandfather     Review of Systems:    Constitutional: No weight loss, fever, chills, weakness or fatigue HEENT: Eyes: No change in vision               Ears, Nose, Throat:  No change in hearing or congestion Skin: No rash or itching Cardiovascular: No chest pain, chest pressure or palpitations   Respiratory: No SOB or cough Gastrointestinal: See HPI and otherwise negative Genitourinary: No dysuria or change in urinary frequency Neurological: No headache, dizziness or syncope Musculoskeletal: No new muscle or joint pain Hematologic: No bleeding or bruising Psychiatric: No history of depression or anxiety    Physical Exam:  Vital signs: BP 130/68   Pulse 76   Ht 5' 1 (1.549 m)   Wt 121 lb 4 oz (55 kg)   LMP  (LMP Unknown)   BMI 22.91 kg/m   Constitutional:   Pleasant female appears to be in NAD, Well developed, alert and cooperative Eyes:    PEERL, EOMI. No icterus. Conjunctiva pink. Neck:  Supple Throat: Oral cavity and pharynx without inflammation, swelling or lesion.  Respiratory: Respirations even and unlabored. Lungs clear to auscultation bilaterally.   No wheezes, crackles, or rhonchi.  Cardiovascular: Normal S1, S2. Regular rate and rhythm. No peripheral edema, cyanosis or pallor.  Gastrointestinal:  Soft, nondistended, RUQ pain, RLQ pain, midline abd pain with palpation. Normal bowel sounds. No appreciable masses or hepatomegaly. Rectal:  Not performed.  Msk:  Symmetrical without gross deformities. Without edema, no deformity or joint abnormality.  Neurologic:  Alert and  oriented x4;  grossly normal neurologically.  Skin:  Dry and intact without significant lesions or rashes.  RELEVANT LABS AND IMAGING: CBC    Latest Ref Rng & Units 09/10/2024    4:39 PM 02/24/2023   10:48 AM 06/03/2020   11:22 AM  CBC  WBC 4.0 - 10.5 K/uL 5.1  4.3  3.8   Hemoglobin 12.0 - 15.0 g/dL 85.3  85.8  85.3   Hematocrit 36.0 - 46.0 % 41.7  41.4  42.2   Platelets 150.0 - 400.0 K/uL 263.0  234.0  222      CMP     Latest Ref Rng & Units 09/10/2024    4:39 PM 09/06/2024   12:00 AM 02/27/2024    9:34 AM  CMP  Glucose 70 - 99 mg/dL 59   95   BUN 6 - 23 mg/dL 12   13   Creatinine 9.59 - 1.20 mg/dL 9.21   9.35   Sodium 864 - 145 mEq/L 139  136     138   Potassium 3.5 - 5.1 mEq/L 4.5   4.4   Chloride 96 - 112 mEq/L 103   103   CO2 19 - 32 mEq/L 30   27   Calcium 8.4 - 10.5 mg/dL 9.8   9.3   Total Protein 6.0 - 8.3 g/dL 6.9   6.6   Total Bilirubin 0.2 - 1.2 mg/dL 0.4   0.7   Alkaline Phos 39 - 117 U/L 81   54   AST 0 - 37 U/L 17   24   ALT 0 - 35 U/L 16   21      This result is from an external source.     Lab Results  Component Value Date   TSH 1.81 02/24/2023   KUB 09/10/24: IMPRESSION: Punctate 2 mm calcification right mid abdomen, consistent with  renal calculus.  Unremarkable bowel gas pattern.    CTAP W/  09/06/24: IMPRESSION:  Postoperative changes in right upper quadrant consistent with recent cholecystectomy.  No evidence for abnormal fluid collection, abscess or dilated bile ducts Stable bilateral nephrolithiasis  08/16/24 US  abd IMPRESSION:  Cholelithiasis without sonographic evidence of acute cholecystitis. Consider HIDA scan if there is continued clinical concern.    CTAP W/ 08/15/24 IMPRESSION: *  Ringlike/spherical calcifications in the region of the expected cystic duct/hepatic duct confluence. This could represent possible partly calcified gallstones. Recommend correlation with ultrasound to further interrogate biliary pathology as a potential source of discomfort. *  Mild bladder wall thickening and surrounding inflammation. Please correlate with urinalysis. *  Mild linear hypoattenuation in the lateral aspect of the right kidney, of doubtful clinical significance as described above. *  Bilateral nephrolithiasis. No obstructive uropathy.   Assessment: Encounter Diagnoses  Name Primary?   Right upper quadrant abdominal pain Yes   Status post laparoscopic cholecystectomy    Nausea without vomiting    Poor appetite    Loss of weight    Bloating    Diarrhea, unspecified type    53 year old female patient 4 weeks postop cholecystectomy for cholelithiasis without acute cholecystitis who presents for RUQ pain radiates to shoulder, nausea, poor appetite, bloating, and diarrhea. Upon examination today patient exhibited great discomfort with laying on exam table and tender to touch when palpating abdomen. Normal LFTs. CTAP 12/25 did not show evidence of fluid collection or abscess. Normal KUB 12/25. Evaluated by urology and note reports the pain unlikely due to the kideny stones. Will order MRI/MRCP to r/o bile duct issues (retained stones, bile leak). We  discussed walking after meals, GERD/bland diet, and continuing the PPI therapy.  For the diarrhea ongoing 5 mths will collect labwork  to rule out inflammatory disease, celiac and/or thyroid  disease.  Will also order GI stool profile to rule out enteric infection.  Patient can continue fiber supplementation.  Patient has completed 2 Cologuard's that were negative however if negative workup and symptoms continue we did discuss pursuing endoscopic procedures.  We also did discuss tell about 10% of patients who have gallbladder removed [address] experience postcholecystectomy diarrhea in addition.   Plan: -order MRI/MRCP s/p cholecystectomy  -continue pantoprazole  40 mg po daily  - Collect CBC, BMET, hepatic panel, lipase -Check CRP, TTG IgA, IgA, TSH  - GI profile stool panel with cdiff -Ibgard 2 capsules with meals  -continue fiber supplement -if negative workup and symptoms continue consider endoscopic procedures  Thank you for the courtesy of this consult. Please call me with any questions or concerns.   Kilea Mccarey, FNP-C [facility] 09/18/2024, 10:20 AM  Cc: Daryl Setter, NP

## 2024-09-19 ENCOUNTER — Other Ambulatory Visit

## 2024-09-19 DIAGNOSIS — R1011 Right upper quadrant pain: Secondary | ICD-10-CM

## 2024-09-19 DIAGNOSIS — R110 Nausea: Secondary | ICD-10-CM

## 2024-09-19 DIAGNOSIS — R197 Diarrhea, unspecified: Secondary | ICD-10-CM

## 2024-09-19 DIAGNOSIS — R634 Abnormal weight loss: Secondary | ICD-10-CM

## 2024-09-19 LAB — IGA: Immunoglobulin A: 165 mg/dL (ref 47–310)

## 2024-09-19 LAB — TISSUE TRANSGLUTAMINASE ABS,IGG,IGA
(tTG) Ab, IgA: <1 U/mL
(tTG) Ab, IgG: <1 U/mL

## 2024-09-20 ENCOUNTER — Ambulatory Visit: Payer: Self-pay | Admitting: Gastroenterology

## 2024-09-21 LAB — GI PROFILE, STOOL, PCR

## 2024-09-22 ENCOUNTER — Ambulatory Visit (HOSPITAL_BASED_OUTPATIENT_CLINIC_OR_DEPARTMENT_OTHER)
Admission: RE | Admit: 2024-09-22 | Discharge: 2024-09-22 | Disposition: A | Discharge status: Home / Self Care | Source: Ambulatory Visit | Attending: Gastroenterology | Admitting: Gastroenterology

## 2024-09-22 DIAGNOSIS — R197 Diarrhea, unspecified: Secondary | ICD-10-CM | POA: Insufficient documentation

## 2024-09-22 DIAGNOSIS — R110 Nausea: Secondary | ICD-10-CM | POA: Insufficient documentation

## 2024-09-22 DIAGNOSIS — R634 Abnormal weight loss: Secondary | ICD-10-CM | POA: Insufficient documentation

## 2024-09-22 DIAGNOSIS — R1011 Right upper quadrant pain: Secondary | ICD-10-CM | POA: Diagnosis present

## 2024-09-22 MED ORDER — GADOBUTROL 1 MMOL/ML IV SOLN
5.0000 mL | Freq: Once | INTRAVENOUS | Status: AC | PRN
  Administered 2024-09-22: 5 mL via INTRAVENOUS

## 2024-09-24 ENCOUNTER — Other Ambulatory Visit: Payer: Self-pay | Admitting: Gastroenterology

## 2024-09-24 DIAGNOSIS — A040 Enteropathogenic Escherichia coli infection: Secondary | ICD-10-CM

## 2024-09-24 MED ORDER — AZITHROMYCIN 500 MG PO TABS: 500.0000 mg | ORAL_TABLET | Freq: Every day | ORAL | 0 refills | Status: AC

## 2024-09-24 NOTE — Progress Notes (Signed)
 Azithromycin  sent in for [facility] persisting for over 2 weeks. Pt with no allergies

## 2024-09-28 ENCOUNTER — Other Ambulatory Visit: Payer: Self-pay | Admitting: Family

## 2024-09-28 DIAGNOSIS — F32A Depression, unspecified: Secondary | ICD-10-CM

## 2024-10-20 ENCOUNTER — Encounter: Payer: Self-pay | Admitting: Family

## 2024-10-20 DIAGNOSIS — L814 Other melanin hyperpigmentation: Secondary | ICD-10-CM

## 2024-12-05 ENCOUNTER — Ambulatory Visit: Admitting: Dermatology

## 2025-02-26 ENCOUNTER — Encounter: Admitting: Family
